# Patient Record
Sex: Male | Born: 1940
Health system: Southern US, Community
[De-identification: ages and names within clinical notes are randomized; demographics above are authoritative.]

## PROBLEM LIST (undated history)

## (undated) DIAGNOSIS — I10 Essential (primary) hypertension: Secondary | ICD-10-CM

## (undated) HISTORY — DX: Essential (primary) hypertension: I10

---

## 2015-11-28 DIAGNOSIS — R69 Illness, unspecified: Secondary | ICD-10-CM | POA: Diagnosis not present

## 2016-03-13 DIAGNOSIS — H2513 Age-related nuclear cataract, bilateral: Secondary | ICD-10-CM | POA: Diagnosis not present

## 2016-03-13 DIAGNOSIS — H524 Presbyopia: Secondary | ICD-10-CM | POA: Diagnosis not present

## 2016-03-13 DIAGNOSIS — Z0100 Encounter for examination of eyes and vision without abnormal findings: Secondary | ICD-10-CM | POA: Diagnosis not present

## 2016-04-17 ENCOUNTER — Ambulatory Visit (INDEPENDENT_AMBULATORY_CARE_PROVIDER_SITE_OTHER): Payer: Medicare HMO | Admitting: Physician Assistant

## 2016-04-17 VITALS — BP 168/90 | HR 84 | Temp 98.1°F | Resp 18 | Wt 127.4 lb

## 2016-04-17 DIAGNOSIS — Z711 Person with feared health complaint in whom no diagnosis is made: Secondary | ICD-10-CM | POA: Diagnosis not present

## 2016-04-17 DIAGNOSIS — R030 Elevated blood-pressure reading, without diagnosis of hypertension: Secondary | ICD-10-CM | POA: Diagnosis not present

## 2016-04-17 MED ORDER — LISINOPRIL 10 MG PO TABS: 10.0000 mg | ORAL_TABLET | Freq: Every day | ORAL | 3 refills | Status: DC

## 2016-04-17 NOTE — Patient Instructions (Addendum)
You have a high blood pressure reading. Please check your blood pressures at home and keep a log - Write down your blood pressure and bring that log to the office in one week.   Your blood pressure can be affected by your lifestyle. Please see below information for ways to help you bring your blood pressure down. Start exercising regularly - try to get in 20-30 minutes of vigorous exercise most days of the week.  If your blood pressure is still elevated in 1 week, we will start you on a medication. Please come back in one week for blood pressure recheck.   Thank you for coming in today. I hope you feel we met your needs.  Feel free to call [facility] if you have any questions or further requests.  Please consider signing up for MyChart if you do not already have it, as this is a great way to communicate with me.  Best,  Whitney McVey, PA-C  T?ng huy?t p Hypertension T?ng huy?t p, th??ng ???c g?i l huy?t p cao, l khi l?c b?m mu qua ??ng m?ch c?a qu v? qu m?nh. ??ng m?ch c?a qu v? l cc m?ch mu mang mu t? tim ?i kh?p c? th?. T?ng huy?t p khi?n tim lm vi?c v?t v? h?n ?? b?m mu v c th? khi?n cc ??ng m?ch tr? ln h?p ho?c c?ng. T?ng huy?t p khng ???c ?i?u tr? ho?c khng ki?m sot ???c c th? d?n t?i nh?i mu c? tim, ??t qu?, b?nh th?n v nh?ng v?n ?? khc. Ch? s? ?o huy?t p g?m m?t ch? s? cao trn m?t ch? s? th?p. Huy?t a?p ly? t???ng cu?a quy? vi? la? d??i 120/80. Ch? s? ??u tin ("??nh") ???c g?i l huy?t p tm thu. ?y l s? ?o p su?t trong ??ng m?ch khi tim qu v? ??p. Ch? s? th? hai ("?y") ???c g?i l huy?t p tm tr??ng. ?y l s? ?o p su?t trong ??ng m?ch khi tim qu v? ngh?Lourdes Sledge nhn g gy ra? Khng r nguyn nhn gy ra tnh tr?ng ny. ?i?u g lm t?ng nguy c?? M?t s? y?u t? nguy c? d?n ??n huy?t p cao c th? ki?m sot ???c. M?t s? y?u t? khc th khng. Nh?ng y?u t? qu v? c th? thay ??i   Ht thu?c.  B? b?nh ti?u ???ng tup 2, cholesterol cao, ho?c c?  hai.  Khng t?p th? d?c ho?c cc ho?t ??ng th? ch?t ??y ??Marland Kitchen  Th?a cn.  ?n qu nhi?u ch?t bo, ???ng, ca-lo, ho?c mu?i ([place]).  U?ng qu nhi?u r??u. Nh?ng y?u t? kh ho?c khng th? thay ??i   B?nh th?n m?n tnh.  C ti?n s? gia ?nh b? cao huy?t p.  ?? tu?i. Nguy c? t?ng ln theo ?? tu?i.  Ch?ng t?c. Qu v? c th? c nguy c? cao h?n n?u qu v? l ng??i M? g?c Phi.  Gi?i tnh. Nam gi?i c nguy c? cao h?n ph? n? tr??c tu?i 45. Sau tu?i 66, ph? n? c nguy c? cao h?n nam gi?i.  Ng?ng th? do t?c ngh?n khi ng?.  C?ng th?ng. Cc d?u hi?u ho?c tri?u ch?ng l g? Huy?t p qu cao (c?n t?ng huy?t p) c th? gy ra:  ?au ??u.  Lo u.  Kh th?.  Ch?y mu cam.  Bu?n nn v nn.  ?au ng?c n?ng.  C? ??ng gi?t gi?t qu v? khng th? ki?m sot ???c (co gi?t). Ch?n ?on tnh tr?ng ny nh? th? no? [place]  tr?ng ny ???c ch?n ?on b?ng cch ?o huy?t p c?a qu v? lc qu v? ng?i, ?? tay trn m?t m?t ph?ng. B?ng qu?n thi?t b? ?o huy?t p s? ???c qu?n tr?c ti?pvo vng da cnh tay pha trn c?a qu v? ngang v?i m?c tim. Huy?t p c?n ???c ?o t nh?t hai l?n trn cng m?t cnh tay. M?t s? tnh tr?ng nh?t ??nh c th? lm cho huy?t p khc nhau gi?a tay ph?i v tay tri c?a qu v?. M?t s? y?u t? nh?t ??nh c th? khi?n ch? s? ?o huy?t p th?p h?n ho?c cao h?n so v?i bnh th??ng (t?ng) trong th?i gian ng?n:  Khi huy?t p c?a qu v? ? phng khm c?a chuyn gia ch?m [place] s?c kh?e cao h?n so v?i lc qu v? ? nh, hi?n t??ng ny ???c g?i l t?ng huy?t p o chong tr?ng. H?u h?t nh?ng ng??i b? tnh tr?ng ny ??u khng c?n dng thu?c.  Khi huy?t p c?a qu v? lc ? nh cao h?n so v?i lc qu v? ? phng khm chuyn gia ch?m [place] s?c kh?e, hi?n t??ng ny ???c g?i l t?ng huy?t p m?t n?. H?u h?t nh?ng ng??i b? tnh tr?ng ny ??u c th? c?n dng thu?c ?? ki?m sot huy?t p. N?u qu v? c ch? s? huy?t p cao trong m?t l?n khm ho?c qu v? c huy?t p bnh th??ng c km cc y?u t? nguy c? khc:  Qu v? c  th? ???c yu c?u tr? l?i vo m?t ngy khc ?? ki?m tra l?i huy?t p.  Qu v? c th? ???c yu c?u theo di huy?t p t?i nh trong vng 1 tu?n ho?c lu h?n. N?u qu v? ???c ch?n ?on b? t?ng huy?t p, qu v? c th? c?n th?c hi?n cc xt nghi?m mu ho?c ki?m tra hnh ?nh khc ?? gip chuyn gia ch?m [place] s?c kh?e hi?u nguy c? t?ng th? m?c cc b?nh tr?ng khc. Tnh tr?ng ny ???c ?i?u tr? nh? th? no? Tnh tr?ng ny ???c ?i?u tr? b?ng cch thay ??i l?i s?ng lnh m?nh, ch?ng h?nh nh? ?n th?c ph?m c l?i cho s?c kh?e, t?p th? d?c nhi?u h?n v gi?m l??ng r??u u?ng vo. N?u thay ??i l?i s?ng khng ?? ?? ??a huy?t p v? m?c c th? ki?m sot ???c, chuyn gia ch?m Sisters s?c kh?e c th? k ??n thu?c, v n?u:  Huy?t p tm thu c?a qu v? trn 130.  Huy?t p tm tr??ng c?a qu v? trn 80. Huy?t p m?c tiu c nhn c?a qu v? c th? khc nhau ty thu?c v tnh tr?ng b?nh l, tu?i v cc nhn t? khc. Tun th? nh?ng h??ng d?n ny ? nh: ?n v u?ng   ?n ch? ?? giu ch?t x? v kali v t natri, ???ng ph? gia v ch?t bo. M?t k? ho?ch ?n m?u c tn ch? ?? ?n DASH (Cch ti?p c?n ?n u?ng ?? gi?m t?ng huy?t p). ?n theo cch ny:  ?n nhi?u tri cy v rau t??i. Vo m?i b?a ?n, c? g?ng dnh m?t n?a ??a cho tri cy v rau.  ?n ng? c?c nguyn h?t, ch?ng h?n nh? m ?ng lm t? b?t m nguyn cm, ho?c bnh m nguyn h?t. Cho ngu? c?c nguyn ca?m va?o m?t ph?n t? ??a c?a quy? vi?.  ?n ho?c hu?ng cc s?n ph?m t? s?a t bo, ch?ng h?n nh? s?a ? b? kem ho?c s?a chua t bo.  Trnh nh?ng mi?ng th?t nhi?u  m?, th?t ? qua ch? bi?n ho?c th?t ??p mu?i v th?t gia c?m c da. Dnh kho?ng m?t ph?n t? ??a c?a qu v? cho cc protein khng m?, ch?ng h?n nh? c, th?t g khng da, ??u, tr?ng, v ??u ph?.  Trnh nh?ng th?c ph?m ch? bi?n ho?c lm s?n. Nh?ng th?c ph?m ny th??ng c nhi?u natri, ???ng ph? gia v ch?t bo h?n.  Gi?m l??ng dng natri hng ngy c?a qu v?. H?u h?t nh?ng ng??i b? t?ng huy?t p ??u nn ?n d??i 1.500 mg natri  m?i ngy.  Gi?i h?n l??ng r??u qu v? u?ng khng qu 1 ly m?i ngy v?i ph? n? khng mang thai v 2 ly m?i ngy v?i nam gi?i. M?t ly t??ng ???ng v?i 12 ao-x? bia, 5 ao-x? r??u vang, ho?c 1 ao-x? r??u m?nh. L?i s?ng   H?p tc v?i chuyn gia ch?m [place] s?c kh?e c?a qu v? ?? duy tr tr?ng l??ng c? th? c l?i cho s?c kh?e ho?c gi?m cn. Hy h?i xem tr?ng l??ng no l l t??ng cho qu v?.  Dnh t nh?t 30 pht ?? t?p th? d?c m c th? khi?n tim qu v? ??p nhanh h?n (t?p th? d?c nh?p ?i?u) h?u h?t cc ngy trong tu?n. Cc ho?t ??ng c th? bao g?m ?i b?, b?i, ho?c ??p xe.  Bao g?m bi t?p t?ng c??ng c? (bi t?p khng l?c), ch?ng h?n nh? bi t?p [place] ho?c nng t?, nh? m?t ph?n c?a thi quen luy?n t?p hng tu?n c?a qu v?. C? g?ng t?p nh?ng lo?i bi t?p ny trong vng 30 pht t?i thi?u 3 ngy m?t tu?n.  Khng s? d?ng b?t k? s?n ph?m no ch?a nicotine ho?c thu?c l, ch?ng ha?n nh? thu?c l d?ng ht v thu?c l ?i?n t?. N?u qu v? c?n gip ?? ?? cai thu?c, hy h?i chuyn gia ch?m [place] s?c kh?e.  Theo di huy?t p c?a qu v? t?i nh theo h??ng d?n c?a chuyn gia ch?m [place] s?c kh?e.  Tun th? t?t c? cc cu?c h?n khm l?i theo ch? d?n c?a chuyn gia ch?m [place] s?c kh?e. ?i?u ny c vai tr quan tr?ng. Thu?c   Ch? s? d?ng thu?c khng k ??n v thu?c k ??n theo ch? d?n c?a chuyn gia ch?m [place] s?c kh?e. Lm theo ch? d?n m?t cch c?n th?n. Thu?c ?i?u tr? huy?t p ph?i ???c dng theo ??n ? k.  Khng b? li?u thu?c huy?t p. B? li?u khi?n qu v? c nguy c? g?p ph?i cc v?n ?? v c th? lm cho thu?c gi?m hi?u qu?Marland Kitchen  Hy h?i chuyn gia ch?m [place] s?c kh?e c?a qu v? v? nh?ng tc d?ng ph? ho?c ph?n ?ng v?i thu?c m qu v? ph?i theo di. Hy lin l?c v?i chuyn gia ch?m [place] s?c kh?e n?u:  Qu v? ngh? qu v? c ph?n ?ng v?i thu?c ?ang dng.  Qu v? b? ?au ??u ti?p t?c tr? l?i (ti pht).  Qu v? c?m th?y chng m?t.  Qu v? b? s?ng ph ? m?t c chn.  Qu v? c v?n ?? v? th? l?c. Yu c?u tr? gip ngay l?p t?c  n?u:  Qu v? b? ?au ??u n?ng ho?c l l?n.  Qu v? b? y?u b?t th??ng ho?c t b.  Quy? vi? ca?m th?y bi? ng?t.  Qu v? b? ?au r?t nhi?u ? ng?c ho?c b?ng.  Qu v? nn nhi?u l?n.  Qu v? b? kh th?. Tm t?t  T?ng huy?t p l khi  l?c b?m mu qua ??ng m?ch c?a qu v? qu m?nh. N?u tnh tr?ng ny khng ???c ki?m sot, n c th? khi?n qu v? g?p ph?i nguy c? bi?n ch?ng nghim tr?ng.  Huy?t p m?c tiu c nhn c?a qu v? c th? khc nhau ty thu?c v tnh tr?ng b?nh l, tu?i v cc nhn t? khc. ??i v?i h?u h?t m?i ng??i, huy?t p bnh th??ng l d??i 120/80.  ?i?u tr? t?ng huy?t p b?ng cch thay ??i l?i s?ng, dng thu?c, ho?c k?t h?p c? hai. Thay ??i l?i s?ng bao g?m gi?m cn, ?n ch? ?? ?n c l?i cho s?c kh?e, t mu?i, t?p th? d?c nhi?u h?n v h?n ch? u?ng r??u. Thng tin ny khng nh?m m?c ?ch thay th? cho l?i khuyn m chuyn gia ch?m [place] s?c kh?e ni v?i qu v?. Hy b?o ??m qu v? ph?i th?o lu?n b?t k? v?n ?? g m qu v? c v?i chuyn gia ch?m [place] s?c kh?e c?a qu v?. Document Released: 01/30/2005 Document Revised: 01/12/2016 Document Reviewed: 01/12/2016 Elsevier Interactive Patient Education  2017 [employer].

## 2016-04-17 NOTE — Progress Notes (Signed)
   Kenneth Moore  MRN: [id number] DOB: 11-25-1940  PCP: No primary care provider on file.  Subjective:  Pt is a 76 year old male who presents to clinic for blood pressure check. He speaks [place]. He is here today with his daughter who is interpreting for him.   His family member is being treated for high blood pressure and he is concerned his may be high as well. Home blood pressures: 137/?, 130's/?. Blood pressure in office is 154/80, repeat is 168/90.  He eats mostly "asian food" rice, meat, vegetables.  Does not exercise.  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  He is feeling well today, no complaints. Denies headache, chest pain, chest tightness, vision changes, syncope, SOB.  Fhx - No heart problems.   Review of Systems  Constitutional: Negative for chills, diaphoresis and fever.  Respiratory: Negative for cough, chest tightness, shortness of breath and wheezing.   Cardiovascular: Negative for chest pain, palpitations and leg swelling.  Gastrointestinal: Negative for diarrhea, nausea and vomiting.  Musculoskeletal: Negative for neck pain.  Neurological: Negative for dizziness, syncope, light-headedness and headaches.  Psychiatric/Behavioral: Negative for sleep disturbance. The patient is not nervous/anxious.     There are no active problems to display for this patient.   No current outpatient prescriptions on file prior to visit.   No current facility-administered medications on file prior to visit.     No Known Allergies   Objective:  BP (!) 154/80 (BP Location: Right Arm, Patient Position: Sitting, Cuff Size: Small)   Pulse (!) 102   Temp 98.1 F (36.7 C) (Oral)   Resp 18   Wt 127 lb 6.4 oz (57.8 kg)   SpO2 98%   Physical Exam  Constitutional: He is oriented to person, place, and time and well-developed, well-nourished, and in no distress. No distress.  Cardiovascular: Normal rate, regular rhythm and normal heart sounds.   Pulmonary/Chest:  Effort normal. No respiratory distress.  Neurological: He is alert and oriented to person, place, and time. GCS score is 15.  Skin: Skin is warm and dry.  Psychiatric: Mood, memory, affect and judgment normal.  Vitals reviewed.   Assessment and Plan :  1. Elevated blood pressure reading 2. Physically well but worried - Recheck vitals - Pt is a pleasant 76 year old male presenting for concerns about his blood pressure. His wife is being treated for hypertension and he would like to know if he needs medications. Blood pressure today is 154/80. He reports his home blood pressures are 130's/?. He appears to be a new pt to the [facility] system - no previous blood pressure readings are on file.     Possible white coat syndrome. Denies cardiac symptoms. Asked pt to keep a log of home blood pressures and bring it with him at his f/u appt in 1 week. DASH diet and exercise encouraged. Consider medication at next OV if blood pressures are high.    Marco CollieWhitney Pascale Maves, PA-C  Primary Care at [facility] 04/17/2016 6:24 PM

## 2016-04-24 ENCOUNTER — Ambulatory Visit: Payer: Medicare HMO

## 2016-05-31 DIAGNOSIS — L409 Psoriasis, unspecified: Secondary | ICD-10-CM | POA: Diagnosis not present

## 2016-11-07 ENCOUNTER — Ambulatory Visit (INDEPENDENT_AMBULATORY_CARE_PROVIDER_SITE_OTHER): Payer: Medicare HMO

## 2016-11-07 ENCOUNTER — Encounter: Payer: Self-pay | Admitting: Emergency Medicine

## 2016-11-07 ENCOUNTER — Ambulatory Visit (INDEPENDENT_AMBULATORY_CARE_PROVIDER_SITE_OTHER): Payer: Medicare HMO | Admitting: Emergency Medicine

## 2016-11-07 ENCOUNTER — Ambulatory Visit: Payer: Medicare HMO

## 2016-11-07 VITALS — BP 128/66 | HR 87 | Temp 98.7°F | Resp 16 | Ht 62.0 in | Wt 125.4 lb

## 2016-11-07 DIAGNOSIS — M25512 Pain in left shoulder: Secondary | ICD-10-CM

## 2016-11-07 DIAGNOSIS — M19011 Primary osteoarthritis, right shoulder: Secondary | ICD-10-CM | POA: Diagnosis not present

## 2016-11-07 DIAGNOSIS — M25511 Pain in right shoulder: Secondary | ICD-10-CM | POA: Insufficient documentation

## 2016-11-07 DIAGNOSIS — Z23 Encounter for immunization: Secondary | ICD-10-CM

## 2016-11-07 DIAGNOSIS — M19019 Primary osteoarthritis, unspecified shoulder: Secondary | ICD-10-CM | POA: Insufficient documentation

## 2016-11-07 DIAGNOSIS — M19012 Primary osteoarthritis, left shoulder: Secondary | ICD-10-CM | POA: Diagnosis not present

## 2016-11-07 MED ORDER — DICLOFENAC SODIUM 50 MG PO TBEC: 50.0000 mg | DELAYED_RELEASE_TABLET | Freq: Two times a day (BID) | ORAL | 1 refills | Status: AC

## 2016-11-07 NOTE — Progress Notes (Signed)
Kenneth Moore 76 y.o.   Chief Complaint  Patient presents with  . Shoulder Pain    BOTH X 2 MONTHS    HISTORY OF PRESENT ILLNESS: This is a 76 y.o. male complaining of bilateral shoulder pain x several months; denies injury. Shoulder Pain   The pain is present in the right shoulder and left shoulder. This is a new problem. The current episode started more than 1 month ago. There has been no history of extremity trauma. The problem occurs constantly. The problem has been waxing and waning. The quality of the pain is described as aching. The pain is at a severity of 5/10. The pain is moderate. Associated symptoms include a limited range of motion. Pertinent negatives include no fever, itching, joint locking, joint swelling, numbness or tingling. The symptoms are aggravated by activity. He has tried nothing for the symptoms. Family history does not include gout or rheumatoid arthritis. There is no history of diabetes or osteoarthritis.     Prior to Admission medications   Medication Sig Start Date End Date Taking? Authorizing Provider  lisinopril (PRINIVIL,ZESTRIL) 10 MG tablet Take 10 mg by mouth daily.   Yes [provider]    No Known Allergies  There are no active problems to display for this patient.   No past medical history on file.  No past surgical history on file.  Social History   Social History  . Marital status: Married    Spouse name: N/A  . Number of children: N/A  . Years of education: N/A   Occupational History  . Not on file.   Social History Main Topics  . Smoking status: Never Smoker  . Smokeless tobacco: Never Used  . Alcohol use No  . Drug use: Unknown  . Sexual activity: Not on file   Other Topics Concern  . Not on file   Social History Narrative  . No narrative on file    No family history on file.   Review of Systems  Constitutional: Negative for chills and fever.  HENT: Negative.   Eyes: Negative.   Respiratory: Negative.   Negative for cough and shortness of breath.   Cardiovascular: Negative.  Negative for chest pain and palpitations.  Gastrointestinal: Negative for abdominal pain, nausea and vomiting.  Genitourinary: Negative for dysuria and hematuria.  Musculoskeletal: Positive for joint pain.  Skin: Negative for itching.  Neurological: Negative for tingling, sensory change, focal weakness and numbness.  Endo/Heme/Allergies: Negative.   All other systems reviewed and are negative.  Vitals:   11/07/16 1614  BP: 128/66  Pulse: 87  Resp: 16  Temp: 98.7 F (37.1 C)  SpO2: 98%     Physical Exam  Constitutional: He is oriented to person, place, and time. He appears well-developed and well-nourished.  HENT:  Head: Normocephalic and atraumatic.  Eyes: Pupils are equal, round, and reactive to light. Conjunctivae and EOM are normal.  Neck: Normal range of motion. Neck supple.  Cardiovascular: Normal rate, regular rhythm and normal heart sounds.   Pulmonary/Chest: Effort normal and breath sounds normal.  Abdominal: Soft. There is no tenderness.  Musculoskeletal:  Shoulders: no deformities; LROM due to pain.  Neurological: He is alert and oriented to person, place, and time. No sensory deficit. He exhibits normal muscle tone.  Skin: Skin is warm and dry. Capillary refill takes less than 2 seconds. No rash noted.  Psychiatric: He has a normal mood and affect. His behavior is normal.  Vitals reviewed.   Dg Shoulder Right  Result Date: 11/07/2016 CLINICAL DATA:  Bilateral shoulder pain.  No reported acute injury. EXAM: RIGHT SHOULDER - 2+ VIEW COMPARISON:  None. FINDINGS: The mineralization and alignment are normal. There is no evidence of acute fracture or dislocation. There are mild acromioclavicular and glenohumeral degenerative changes. The subacromial space is adequately preserved, although there is mild subacromial spurring. Degenerative changes are noted within the cervical spine. IMPRESSION: No acute  osseous findings.  Mild degenerative changes. Electronically Signed   By: Carey Bullocks M.D.   On: 11/07/2016 17:12   Dg Shoulder Left  Result Date: 11/07/2016 CLINICAL DATA:  Bilateral shoulder pain.  No reported acute injury. EXAM: LEFT SHOULDER - 2+ VIEW COMPARISON:  None. FINDINGS: The mineralization and alignment are normal. There is no evidence of acute fracture or dislocation. Mild acromioclavicular and glenohumeral degenerative changes. The subacromial space is adequately preserved, although there is mild subacromial spurring. IMPRESSION: No acute osseous findings.  Mild degenerative changes. Electronically Signed   By: Carey Bullocks M.D.   On: 11/07/2016 17:11     ASSESSMENT & PLAN: Manus was seen today for shoulder pain.  Diagnoses and all orders for this visit:  Pain of both shoulder joints -     DG Shoulder Left; Future -     DG Shoulder Right; Future -     diclofenac (VOLTAREN) 50 MG EC tablet; Take 1 tablet (50 mg total) by mouth 2 (two) times daily.  Arthritis, shoulder region  Other orders -     Pneumococcal conjugate vaccine 13-valent IM    Patient Instructions       IF you received an x-ray today, you will receive an invoice from [facility]. Please contact [facility] at [phone] with questions or concerns regarding your invoice.   IF you received labwork today, you will receive an invoice from [place]. Please contact [facility] at [phone] with questions or concerns regarding your invoice.   Our billing staff will not be able to assist you with questions regarding bills from these companies.  You will be contacted with the lab results as soon as they are available. The fastest way to get your results is to activate your My Chart account. Instructions are located on the last page of this paperwork. If you have not heard from [place] regarding the results in 2 weeks, please contact this office.      Place shoulder pain patient  instructions here.  Shoulder Pain Many things can cause shoulder pain, including:  An injury.  Moving the arm in the same way again and again (overuse).  Joint pain (arthritis).  Follow these instructions at home: Take these actions to help with your pain:  Squeeze a soft ball or a foam pad as much as you can. This helps to prevent swelling. It also makes the arm stronger.  Take over-the-counter and prescription medicines only as told by your doctor.  If told, put ice on the area: ? Put ice in a plastic bag. ? Place a towel between your skin and the bag. ? Leave the ice on for 20 minutes, 2-3 times per day. Stop putting on ice if it does not help with the pain.  If you were given a shoulder sling or immobilizer: ? Wear it as told. ? Remove it to shower or bathe. ? Move your arm as little as possible. ? Keep your hand moving. This helps prevent swelling.  Contact a doctor if:  Your pain gets worse.  Medicine does not help your pain.  You have new pain in your arm, hand, or fingers. Get help right away if:  Your arm, hand, or fingers: ? Tingle. ? Are numb. ? Are swollen. ? Are painful. ? Turn white or blue. This information is not intended to replace advice given to you by your health care provider. Make sure you discuss any questions you have with your health care provider. Document Released: 07/19/2007 Document Revised: 09/26/2015 Document Reviewed: 05/25/2014 Elsevier Interactive Patient Education  2018 [employer].      Edwina Barth, MD [facility]

## 2016-11-07 NOTE — Patient Instructions (Addendum)
     IF you received an x-ray today, you will receive an invoice from [facility]. Please contact [facility] at [phone] with questions or concerns regarding your invoice.   IF you received labwork today, you will receive an invoice from [place]. Please contact [facility] at [phone] with questions or concerns regarding your invoice.   Our billing staff will not be able to assist you with questions regarding bills from these companies.  You will be contacted with the lab results as soon as they are available. The fastest way to get your results is to activate your My Chart account. Instructions are located on the last page of this paperwork. If you have not heard from [place] regarding the results in 2 weeks, please contact this office.      Place shoulder pain patient instructions here.  Shoulder Pain Many things can cause shoulder pain, including:  An injury.  Moving the arm in the same way again and again (overuse).  Joint pain (arthritis).  Follow these instructions at home: Take these actions to help with your pain:  Squeeze a soft ball or a foam pad as much as you can. This helps to prevent swelling. It also makes the arm stronger.  Take over-the-counter and prescription medicines only as told by your doctor.  If told, put ice on the area: ? Put ice in a plastic bag. ? Place a towel between your skin and the bag. ? Leave the ice on for 20 minutes, 2-3 times per day. Stop putting on ice if it does not help with the pain.  If you were given a shoulder sling or immobilizer: ? Wear it as told. ? Remove it to shower or bathe. ? Move your arm as little as possible. ? Keep your hand moving. This helps prevent swelling.  Contact a doctor if:  Your pain gets worse.  Medicine does not help your pain.  You have new pain in your arm, hand, or fingers. Get help right away if:  Your arm, hand, or fingers: ? Tingle. ? Are numb. ? Are swollen. ? Are  painful. ? Turn white or blue. This information is not intended to replace advice given to you by your health care provider. Make sure you discuss any questions you have with your health care provider. Document Released: 07/19/2007 Document Revised: 09/26/2015 Document Reviewed: 05/25/2014 Elsevier Interactive Patient Education  [employer].

## 2016-11-28 DIAGNOSIS — R69 Illness, unspecified: Secondary | ICD-10-CM | POA: Diagnosis not present

## 2016-12-13 ENCOUNTER — Encounter: Payer: Self-pay | Admitting: *Deleted

## 2017-04-17 ENCOUNTER — Ambulatory Visit (INDEPENDENT_AMBULATORY_CARE_PROVIDER_SITE_OTHER): Payer: Medicare HMO

## 2017-04-17 ENCOUNTER — Other Ambulatory Visit: Payer: Self-pay

## 2017-04-17 ENCOUNTER — Ambulatory Visit (INDEPENDENT_AMBULATORY_CARE_PROVIDER_SITE_OTHER): Payer: Medicare HMO | Admitting: Emergency Medicine

## 2017-04-17 ENCOUNTER — Ambulatory Visit: Payer: Medicare HMO | Admitting: Emergency Medicine

## 2017-04-17 ENCOUNTER — Encounter: Payer: Self-pay | Admitting: Emergency Medicine

## 2017-04-17 VITALS — BP 150/73 | HR 105 | Temp 98.1°F | Resp 16 | Ht 62.0 in | Wt 127.2 lb

## 2017-04-17 DIAGNOSIS — J22 Unspecified acute lower respiratory infection: Secondary | ICD-10-CM | POA: Insufficient documentation

## 2017-04-17 DIAGNOSIS — R059 Cough, unspecified: Secondary | ICD-10-CM

## 2017-04-17 DIAGNOSIS — R05 Cough: Secondary | ICD-10-CM | POA: Diagnosis not present

## 2017-04-17 MED ORDER — DOXYCYCLINE HYCLATE 100 MG PO TABS: 100.0000 mg | ORAL_TABLET | Freq: Two times a day (BID) | ORAL | 0 refills | Status: DC

## 2017-04-17 MED ORDER — BENZONATATE 200 MG PO CAPS
200.0000 mg | ORAL_CAPSULE | Freq: Two times a day (BID) | ORAL | 0 refills | Status: DC | PRN
Start: 2017-04-17 — End: 2019-01-02

## 2017-04-17 NOTE — Patient Instructions (Addendum)
     IF you received an x-ray today, you will receive an invoice from [facility]. Please contact [facility] at [phone] with questions or concerns regarding your invoice.   IF you received labwork today, you will receive an invoice from [facility]. Please contact [facility] at [phone] with questions or concerns regarding your invoice.   Our billing staff will not be able to assist you with questions regarding bills from these companies.  You will be contacted with the lab results as soon as they are available. The fastest way to get your results is to activate your My Chart account. Instructions are located on the last page of this paperwork. If you have not heard from us regarding the results in 2 weeks, please contact this office.     Cough, Adult A cough helps to clear your throat and lungs. A cough may last only 2-3 weeks (acute), or it may last longer than 8 weeks (chronic). Many different things can cause a cough. A cough may be a sign of an illness or another medical condition. Follow these instructions at home:  Pay attention to any changes in your cough.  Take medicines only as told by your doctor. ? If you were prescribed an antibiotic medicine, take it as told by your doctor. Do not stop taking it even if you start to feel better. ? Talk with your doctor before you try using a cough medicine.  Drink enough fluid to keep your pee (urine) clear or pale yellow.  If the air is dry, use a cold steam vaporizer or humidifier in your home.  Stay away from things that make you cough at work or at home.  If your cough is worse at night, try using extra pillows to raise your head up higher while you sleep.  Do not smoke, and try not to be around smoke. If you need help quitting, ask your doctor.  Do not have caffeine.  Do not drink alcohol.  Rest as needed. Contact a doctor if:  You have new problems (symptoms).  You cough up yellow fluid  (pus).  Your cough does not get better after 2-3 weeks, or your cough gets worse.  Medicine does not help your cough and you are not sleeping well.  You have pain that gets worse or pain that is not helped with medicine.  You have a fever.  You are losing weight and you do not know why.  You have night sweats. Get help right away if:  You cough up blood.  You have trouble breathing.  Your heartbeat is very fast. This information is not intended to replace advice given to you by your health care provider. Make sure you discuss any questions you have with your health care provider. Document Released: 10/13/2010 Document Revised: 07/08/2015 Document Reviewed: 04/08/2014 Elsevier Interactive Patient Education  2018 [employer]  

## 2017-04-17 NOTE — Progress Notes (Signed)
Kenneth Moore 77 y.o.   Chief Complaint  Patient presents with  . Cough    per patient for over a half month    HISTORY OF PRESENT ILLNESS: This is a 77 y.o. male complaining of cough productive of clear phlegm for the past 2-3 weeks.  Denies fever or chills.  Denies hemoptysis.  Denies difficulty breathing or chest pain.  Non-smoker.  No history of COPD.  Denies any other significant symptoms.   Cough  This is a new problem. The current episode started 1 to 4 weeks ago. The problem has been gradually worsening. The problem occurs constantly. The cough is productive of sputum (Clear sputum). Pertinent negatives include no chest pain, chills, ear congestion, ear pain, fever, headaches, heartburn, hemoptysis, myalgias, nasal congestion, postnasal drip, rash, rhinorrhea, sore throat, shortness of breath, sweats, weight loss or wheezing. Nothing aggravates the symptoms. He has tried nothing for the symptoms. There is no history of asthma, bronchitis, COPD, emphysema or pneumonia.     Prior to Admission medications   Medication Sig Start Date End Date Taking? Authorizing Provider  lisinopril (PRINIVIL,ZESTRIL) 10 MG tablet Take 10 mg by mouth daily.   Yes [provider]  Pseudoephedrine-Guaifenesin (MUCINEX D PO) Take by mouth as needed.   Yes [provider]    No Known Allergies  Patient Active Problem List   Diagnosis Date Noted  . Pain of both shoulder joints 11/07/2016  . Arthritis, shoulder region 11/07/2016    No past medical history on file.    Social History   Socioeconomic History  . Marital status: Married    Spouse name: Not on file  . Number of children: Not on file  . Years of education: Not on file  . Highest education level: Not on file  Social Needs  . Financial resource strain: Not on file  . Food insecurity - worry: Not on file  . Food insecurity - inability: Not on file  . Transportation needs - medical: Not on file  . Transportation  needs - non-medical: Not on file  Occupational History  . Not on file  Tobacco Use  . Smoking status: Never Smoker  . Smokeless tobacco: Never Used  Substance and Sexual Activity  . Alcohol use: No  . Drug use: Not on file  . Sexual activity: Not on file  Other Topics Concern  . Not on file  Social History Narrative  . Not on file    No family history on file.   Review of Systems  Constitutional: Negative for chills, fever, malaise/fatigue and weight loss.  HENT: Negative.  Negative for ear pain, postnasal drip, rhinorrhea and sore throat.   Eyes: Negative.  Negative for blurred vision and double vision.  Respiratory: Positive for cough. Negative for hemoptysis, shortness of breath and wheezing.   Cardiovascular: Negative for chest pain and palpitations.  Gastrointestinal: Negative.  Negative for abdominal pain, diarrhea, heartburn, nausea and vomiting.  Genitourinary: Negative.  Negative for dysuria and hematuria.  Musculoskeletal: Negative.  Negative for back pain, myalgias and neck pain.  Skin: Negative.  Negative for rash.  Neurological: Negative.  Negative for dizziness and headaches.  Endo/Heme/Allergies: Negative.   All other systems reviewed and are negative.   Vitals:   04/17/17 1113  BP: (!) 150/73  Pulse: (!) 105  Resp: 16  Temp: 98.1 F (36.7 C)  SpO2: 95%    Physical Exam  Constitutional: He is oriented to person, place, and time. He appears well-developed and well-nourished.  HENT:  Head: Normocephalic and atraumatic.  Nose: Nose normal.  Mouth/Throat: No oropharyngeal exudate.  Poor dentition  Eyes: Conjunctivae and EOM are normal. Pupils are equal, round, and reactive to light.  Neck: Normal range of motion. Neck supple. No JVD present. No thyromegaly present.  Cardiovascular: Normal rate, regular rhythm and normal heart sounds.  Pulmonary/Chest: Effort normal and breath sounds normal.  Abdominal: Soft. Bowel sounds are normal. He exhibits no  distension. There is no tenderness.  Musculoskeletal: Normal range of motion. He exhibits no edema or tenderness.  Lymphadenopathy:    He has no cervical adenopathy.  Neurological: He is alert and oriented to person, place, and time. No sensory deficit. He exhibits normal muscle tone.  Skin: Skin is warm and dry. Capillary refill takes less than 2 seconds. No rash noted.  Psychiatric: He has a normal mood and affect. His behavior is normal.  Vitals reviewed.   Chest x-ray reviewed by me.  NAD.  Radiologist report reviewed.  Chest x-ray also reviewed with patient and daughter in the room.  Dg Chest 2 View  Result Date: 04/17/2017 CLINICAL DATA:  Cough.  Nonsmoker. EXAM: CHEST  2 VIEW COMPARISON:  None in PACs FINDINGS: The lungs are adequately inflated. The interstitial markings are coarse. There is no alveolar infiltrate or pleural effusion. The heart and pulmonary vascularity are normal. There is gentle dextrocurvature centered in the midthoracic spine. IMPRESSION: Mild interstitial prominence bilaterally likely reflects chronic bronchitic changes. No alveolar pneumonia nor CHF. Electronically Signed   By: David  [place] M.D.   On: 04/17/2017 11:37    ASSESSMENT & PLAN: Grady was seen today for cough.  Diagnoses and all orders for this visit:  Cough -     CBC with Differential/Platelet -     Comprehensive metabolic panel -     DG Chest 2 View; Future -     benzonatate (TESSALON) 200 MG capsule; Take 1 capsule (200 mg total) by mouth 2 (two) times daily as needed for cough.  Lower respiratory infection -     doxycycline (VIBRA-TABS) 100 MG tablet; Take 1 tablet (100 mg total) by mouth 2 (two) times daily.    Patient Instructions       IF you received an x-ray today, you will receive an invoice from [facility]. Please contact [facility] at [phone] with questions or concerns regarding your invoice.   IF you received labwork today, you will receive an  invoice from [place]. Please contact [facility] at [phone] with questions or concerns regarding your invoice.   Our billing staff will not be able to assist you with questions regarding bills from these companies.  You will be contacted with the lab results as soon as they are available. The fastest way to get your results is to activate your My Chart account. Instructions are located on the last page of this paperwork. If you have not heard from [place] regarding the results in 2 weeks, please contact this office.     Cough, Adult A cough helps to clear your throat and lungs. A cough may last only 2-3 weeks (acute), or it may last longer than 8 weeks (chronic). Many different things can cause a cough. A cough may be a sign of an illness or another medical condition. Follow these instructions at home:  Pay attention to any changes in your cough.  Take medicines only as told by your doctor. ? If you were prescribed an antibiotic medicine, take it as told by your doctor.  Do not stop taking it even if you start to feel better. ? Talk with your doctor before you try using a cough medicine.  Drink enough fluid to keep your pee (urine) clear or pale yellow.  If the air is dry, use a cold steam vaporizer or humidifier in your home.  Stay away from things that make you cough at work or at home.  If your cough is worse at night, try using extra pillows to raise your head up higher while you sleep.  Do not smoke, and try not to be around smoke. If you need help quitting, ask your doctor.  Do not have caffeine.  Do not drink alcohol.  Rest as needed. Contact a doctor if:  You have new problems (symptoms).  You cough up yellow fluid (pus).  Your cough does not get better after 2-3 weeks, or your cough gets worse.  Medicine does not help your cough and you are not sleeping well.  You have pain that gets worse or pain that is not helped with medicine.  You have a fever.  You are  losing weight and you do not know why.  You have night sweats. Get help right away if:  You cough up blood.  You have trouble breathing.  Your heartbeat is very fast. This information is not intended to replace advice given to you by your health care provider. Make sure you discuss any questions you have with your health care provider. Document Released: 10/13/2010 Document Revised: 07/08/2015 Document Reviewed: 04/08/2014 Elsevier Interactive Patient Education  2018 Elsevier Inc.      Edwina BarthMiguel Marlene Beidler, MD [facility]

## 2017-04-18 ENCOUNTER — Encounter: Payer: Self-pay | Admitting: Radiology

## 2017-04-18 LAB — CBC WITH DIFFERENTIAL/PLATELET
Basophils Absolute: 0 10*3/uL (ref 0.0–0.2)
Basos: 0 %
EOS (ABSOLUTE): 0.1 10*3/uL (ref 0.0–0.4)
EOS: 1 %
HEMOGLOBIN: 15 g/dL (ref 13.0–17.7)
Hematocrit: 44.6 % (ref 37.5–51.0)
IMMATURE GRANS (ABS): 0 10*3/uL (ref 0.0–0.1)
Immature Granulocytes: 1 %
LYMPHS: 23 %
Lymphocytes Absolute: 1.8 10*3/uL (ref 0.7–3.1)
MCH: 32.5 pg (ref 26.6–33.0)
MCHC: 33.6 g/dL (ref 31.5–35.7)
MCV: 97 fL (ref 79–97)
MONOCYTES: 6 %
Monocytes Absolute: 0.5 10*3/uL (ref 0.1–0.9)
Neutrophils Absolute: 5.6 10*3/uL (ref 1.4–7.0)
Neutrophils: 69 %
Platelets: 203 10*3/uL (ref 150–379)
RBC: 4.61 x10E6/uL (ref 4.14–5.80)
RDW: 13.4 % (ref 12.3–15.4)
WBC: 8.1 10*3/uL (ref 3.4–10.8)

## 2017-04-18 LAB — COMPREHENSIVE METABOLIC PANEL
ALBUMIN: 4 g/dL (ref 3.5–4.8)
ALT: 25 IU/L (ref 0–44)
AST: 25 IU/L (ref 0–40)
Albumin/Globulin Ratio: 1.4 (ref 1.2–2.2)
Alkaline Phosphatase: 87 IU/L (ref 39–117)
BUN / CREAT RATIO: 14 (ref 10–24)
BUN: 15 mg/dL (ref 8–27)
Bilirubin Total: <0.2 mg/dL (ref 0.0–1.2)
CALCIUM: 9.5 mg/dL (ref 8.6–10.2)
CO2: 20 mmol/L (ref 20–29)
CREATININE: 1.1 mg/dL (ref 0.76–1.27)
Chloride: 103 mmol/L (ref 96–106)
GFR, EST AFRICAN AMERICAN: 76 mL/min/{1.73_m2} (ref >59)
GFR, EST NON AFRICAN AMERICAN: 65 mL/min/{1.73_m2} (ref >59)
GLUCOSE: 162 mg/dL — AB (ref 65–99)
Globulin, Total: 2.9 g/dL (ref 1.5–4.5)
Potassium: 4 mmol/L (ref 3.5–5.2)
Sodium: 140 mmol/L (ref 134–144)
TOTAL PROTEIN: 6.9 g/dL (ref 6.0–8.5)

## 2017-04-29 ENCOUNTER — Other Ambulatory Visit: Payer: Self-pay | Admitting: Physician Assistant

## 2017-04-29 DIAGNOSIS — Z711 Person with feared health complaint in whom no diagnosis is made: Secondary | ICD-10-CM

## 2017-05-06 ENCOUNTER — Other Ambulatory Visit: Payer: Self-pay | Admitting: Physician Assistant

## 2017-05-06 DIAGNOSIS — Z711 Person with feared health complaint in whom no diagnosis is made: Secondary | ICD-10-CM

## 2017-07-24 ENCOUNTER — Ambulatory Visit: Payer: Medicare HMO | Admitting: Emergency Medicine

## 2017-11-19 DIAGNOSIS — R69 Illness, unspecified: Secondary | ICD-10-CM | POA: Diagnosis not present

## 2017-12-24 DIAGNOSIS — L409 Psoriasis, unspecified: Secondary | ICD-10-CM | POA: Diagnosis not present

## 2018-03-05 ENCOUNTER — Telehealth: Payer: Self-pay | Admitting: *Deleted

## 2018-03-05 NOTE — Telephone Encounter (Signed)
Lm to schedule AWV for medicare

## 2018-05-09 ENCOUNTER — Telehealth: Payer: Self-pay | Admitting: Emergency Medicine

## 2018-05-09 NOTE — Telephone Encounter (Unsigned)
Copied from CRM [id number]. Topic: Quick Communication - Rx Refill/Question >> May 09, 2018  9:57 AM Marylen Ponto wrote: Medication: lisinopril (PRINIVIL,ZESTRIL) 10 MG tablet  Has the patient contacted their pharmacy? yes   Preferred Pharmacy (with phone number or street name): [facility] [phone] [place], [place] - [address] [phone] (Phone)  [phone] (Fax)  Agent: Please be advised that RX refills may take up to 3 business days. We ask that you follow-up with your pharmacy.

## 2018-05-11 ENCOUNTER — Other Ambulatory Visit: Payer: Self-pay

## 2018-05-11 MED ORDER — LISINOPRIL 10 MG PO TABS: 10.0000 mg | ORAL_TABLET | Freq: Every day | ORAL | 0 refills | Status: DC

## 2018-05-11 NOTE — Telephone Encounter (Signed)
Called pt and Informed she needs an OV for refills.

## 2018-05-27 ENCOUNTER — Ambulatory Visit: Payer: Medicare HMO | Admitting: Emergency Medicine

## 2018-06-03 ENCOUNTER — Other Ambulatory Visit: Payer: Self-pay | Admitting: Emergency Medicine

## 2018-06-19 ENCOUNTER — Other Ambulatory Visit: Payer: Self-pay | Admitting: *Deleted

## 2018-06-24 ENCOUNTER — Other Ambulatory Visit: Payer: Self-pay

## 2018-06-24 ENCOUNTER — Encounter: Payer: Self-pay | Admitting: Emergency Medicine

## 2018-06-24 ENCOUNTER — Ambulatory Visit (INDEPENDENT_AMBULATORY_CARE_PROVIDER_SITE_OTHER): Payer: Medicare HMO | Admitting: Emergency Medicine

## 2018-06-24 VITALS — BP 116/75 | HR 110 | Temp 98.3°F | Resp 20 | Ht 62.6 in | Wt 127.8 lb

## 2018-06-24 DIAGNOSIS — Z23 Encounter for immunization: Secondary | ICD-10-CM | POA: Diagnosis not present

## 2018-06-24 DIAGNOSIS — I10 Essential (primary) hypertension: Secondary | ICD-10-CM | POA: Insufficient documentation

## 2018-06-24 MED ORDER — LISINOPRIL 10 MG PO TABS
10.0000 mg | ORAL_TABLET | Freq: Every day | ORAL | 3 refills | Status: DC
Start: 2018-06-24 — End: 2019-01-02

## 2018-06-24 NOTE — Patient Instructions (Addendum)
   If you have lab work done today you will be contacted with your lab results within the next 2 weeks.  If you have not heard from us then please contact us. The fastest way to get your results is to register for My Chart.   IF you received an x-ray today, you will receive an invoice from [facility]. Please contact [facility] at [phone] with questions or concerns regarding your invoice.   IF you received labwork today, you will receive an invoice from [facility]. Please contact [facility] at [phone] with questions or concerns regarding your invoice.   Our billing staff will not be able to assist you with questions regarding bills from these companies.  You will be contacted with the lab results as soon as they are available. The fastest way to get your results is to activate your My Chart account. Instructions are located on the last page of this paperwork. If you have not heard from us regarding the results in 2 weeks, please contact this office.       Hypertension Hypertension, commonly called high blood pressure, is when the force of blood pumping through the arteries is too strong. The arteries are the blood vessels that carry blood from the heart throughout the body. Hypertension forces the heart to work harder to pump blood and may cause arteries to become narrow or stiff. Having untreated or uncontrolled hypertension can cause heart attacks, strokes, kidney disease, and other problems. A blood pressure reading consists of a higher number over a lower number. Ideally, your blood pressure should be below 120/80. The first ("top") number is called the systolic pressure. It is a measure of the pressure in your arteries as your heart beats. The second ("bottom") number is called the diastolic pressure. It is a measure of the pressure in your arteries as the heart relaxes. What are the causes? The cause of this condition is not known. What increases the  risk? Some risk factors for high blood pressure are under your control. Others are not. Factors you can change  Smoking.  Having type 2 diabetes mellitus, high cholesterol, or both.  Not getting enough exercise or physical activity.  Being overweight.  Having too much fat, sugar, calories, or salt (sodium) in your diet.  Drinking too much alcohol. Factors that are difficult or impossible to change  Having chronic kidney disease.  Having a family history of high blood pressure.  Age. Risk increases with age.  Race. You may be at higher risk if you are African-American.  Gender. Men are at higher risk than women before age 45. After age 65, women are at higher risk than men.  Having obstructive sleep apnea.  Stress. What are the signs or symptoms? Extremely high blood pressure (hypertensive crisis) may cause:  Headache.  Anxiety.  Shortness of breath.  Nosebleed.  Nausea and vomiting.  Severe chest pain.  Jerky movements you cannot control (seizures). How is this diagnosed? This condition is diagnosed by measuring your blood pressure while you are seated, with your arm resting on a surface. The cuff of the blood pressure monitor will be placed directly against the skin of your upper arm at the level of your heart. It should be measured at least twice using the same arm. Certain conditions can cause a difference in blood pressure between your right and left arms. Certain factors can cause blood pressure readings to be lower or higher than normal (elevated) for a short period of time:    When your blood pressure is higher when you are in a health care provider's office than when you are at home, this is called white coat hypertension. Most people with this condition do not need medicines.  When your blood pressure is higher at home than when you are in a health care provider's office, this is called masked hypertension. Most people with this condition may need medicines  to control blood pressure. If you have a high blood pressure reading during one visit or you have normal blood pressure with other risk factors:  You may be asked to return on a different day to have your blood pressure checked again.  You may be asked to monitor your blood pressure at home for 1 week or longer. If you are diagnosed with hypertension, you may have other blood or imaging tests to help your health care provider understand your overall risk for other conditions. How is this treated? This condition is treated by making healthy lifestyle changes, such as eating healthy foods, exercising more, and reducing your alcohol intake. Your health care provider may prescribe medicine if lifestyle changes are not enough to get your blood pressure under control, and if:  Your systolic blood pressure is above 130.  Your diastolic blood pressure is above 80. Your personal target blood pressure may vary depending on your medical conditions, your age, and other factors. Follow these instructions at home: Eating and drinking   Eat a diet that is high in fiber and potassium, and low in sodium, added sugar, and fat. An example eating plan is called the DASH (Dietary Approaches to Stop Hypertension) diet. To eat this way: ? Eat plenty of fresh fruits and vegetables. Try to fill half of your plate at each meal with fruits and vegetables. ? Eat whole grains, such as whole wheat pasta, brown rice, or whole grain bread. Fill about one quarter of your plate with whole grains. ? Eat or drink low-fat dairy products, such as skim milk or low-fat yogurt. ? Avoid fatty cuts of meat, processed or cured meats, and poultry with skin. Fill about one quarter of your plate with lean proteins, such as fish, chicken without skin, beans, eggs, and tofu. ? Avoid premade and processed foods. These tend to be higher in sodium, added sugar, and fat.  Reduce your daily sodium intake. Most people with hypertension should  eat less than 1,500 mg of sodium a day.  Limit alcohol intake to no more than 1 drink a day for nonpregnant women and 2 drinks a day for men. One drink equals 12 oz of beer, 5 oz of wine, or 1 oz of hard liquor. Lifestyle   Work with your health care provider to maintain a healthy body weight or to lose weight. Ask what an ideal weight is for you.  Get at least 30 minutes of exercise that causes your heart to beat faster (aerobic exercise) most days of the week. Activities may include walking, swimming, or biking.  Include exercise to strengthen your muscles (resistance exercise), such as pilates or lifting weights, as part of your weekly exercise routine. Try to do these types of exercises for 30 minutes at least 3 days a week.  Do not use any products that contain nicotine or tobacco, such as cigarettes and e-cigarettes. If you need help quitting, ask your health care provider.  Monitor your blood pressure at home as told by your health care provider.  Keep all follow-up visits as told by your health care provider.   This is important. Medicines  Take over-the-counter and prescription medicines only as told by your health care provider. Follow directions carefully. Blood pressure medicines must be taken as prescribed.  Do not skip doses of blood pressure medicine. Doing this puts you at risk for problems and can make the medicine less effective.  Ask your health care provider about side effects or reactions to medicines that you should watch for. Contact a health care provider if:  You think you are having a reaction to a medicine you are taking.  You have headaches that keep coming back (recurring).  You feel dizzy.  You have swelling in your ankles.  You have trouble with your vision. Get help right away if:  You develop a severe headache or confusion.  You have unusual weakness or numbness.  You feel faint.  You have severe pain in your chest or abdomen.  You vomit  repeatedly.  You have trouble breathing. Summary  Hypertension is when the force of blood pumping through your arteries is too strong. If this condition is not controlled, it may put you at risk for serious complications.  Your personal target blood pressure may vary depending on your medical conditions, your age, and other factors. For most people, a normal blood pressure is less than 120/80.  Hypertension is treated with lifestyle changes, medicines, or a combination of both. Lifestyle changes include weight loss, eating a healthy, low-sodium diet, exercising more, and limiting alcohol. This information is not intended to replace advice given to you by your health care provider. Make sure you discuss any questions you have with your health care provider. Document Released: 01/30/2005 Document Revised: 12/29/2015 Document Reviewed: 12/29/2015 Elsevier Interactive Patient Education  2019 Elsevier Inc.  

## 2018-06-24 NOTE — Assessment & Plan Note (Signed)
Well-controlled blood pressure.  Continue present medication.  Follow-up in 6 months.

## 2018-06-24 NOTE — Progress Notes (Addendum)
Kenneth Moore 78 y.o.   Chief Complaint  Patient presents with   Hypertension    f/u   Medication Refill    lisinopril    HISTORY OF PRESENT ILLNESS: This is a 78 y.o. male here for follow-up of hypertension on medication refill.  Patient takes lisinopril 10 mg a day.  Daughter helping with translation.  Patient doing well.  Has no complaints or medical concerns today. Health maintenance review reveals need for pneumococcal vaccine 23. Otherwise doing well with no complaints.  BP Readings from Last 3 Encounters:  06/24/18 116/75  04/17/17 (!) 150/73  11/07/16 128/66    HPI   Prior to Admission medications   Medication Sig Start Date End Date Taking? Authorizing Provider  lisinopril (ZESTRIL) 10 MG tablet TAKE 1 TABLET BY MOUTH EVERY DAY 06/03/18  Yes Ariyon Gerstenberger, Eilleen Kempf, MD  benzonatate (TESSALON) 200 MG capsule Take 1 capsule (200 mg total) by mouth 2 (two) times daily as needed for cough. Patient not taking: Reported on 06/24/2018 04/17/17   Georgina Quint, MD  doxycycline (VIBRA-TABS) 100 MG tablet Take 1 tablet (100 mg total) by mouth 2 (two) times daily. Patient not taking: Reported on 06/24/2018 04/17/17   Georgina Quint, MD  Pseudoephedrine-Guaifenesin [facility] D PO) Take by mouth as needed.    [provider]    No Known Allergies  Patient Active Problem List   Diagnosis Date Noted   Pain of both shoulder joints 11/07/2016   Arthritis, shoulder region 11/07/2016    Past Medical History:  Diagnosis Date   Hypertension     History reviewed. No pertinent surgical history.  Social History   Socioeconomic History   Marital status: Married    Spouse name: Not on file   Number of children: 3   Years of education: Not on file   Highest education level: Not on file  Occupational History   Not on file  Social Needs   Financial resource strain: Not on file   Food insecurity:    Worry: Not on file    Inability: Not on file    Transportation needs:    Medical: Not on file    Non-medical: Not on file  Tobacco Use   Smoking status: Never Smoker   Smokeless tobacco: Never Used  Substance and Sexual Activity   Alcohol use: No   Drug use: Not Currently   Sexual activity: Not Currently  Lifestyle   Physical activity:    Days per week: Not on file    Minutes per session: Not on file   Stress: Not on file  Relationships   Social connections:    Talks on phone: Not on file    Gets together: Not on file    Attends religious service: Not on file    Active member of club or organization: Not on file    Attends meetings of clubs or organizations: Not on file    Relationship status: Not on file   Intimate partner violence:    Fear of current or ex partner: Not on file    Emotionally abused: Not on file    Physically abused: Not on file    Forced sexual activity: Not on file  Other Topics Concern   Not on file  Social History Narrative   Not on file    History reviewed. No pertinent family history.   Review of Systems  Constitutional: Negative.  Negative for chills, fever and weight loss.  HENT: Negative.  Negative for  congestion, nosebleeds and sore throat.   Eyes: Negative.   Respiratory: Negative.  Negative for cough, hemoptysis and shortness of breath.   Cardiovascular: Negative.  Negative for chest pain, palpitations and leg swelling.  Gastrointestinal: Negative.  Negative for diarrhea, nausea and vomiting.  Genitourinary: Negative.  Negative for dysuria and hematuria.  Musculoskeletal: Negative.  Negative for myalgias.  Skin: Negative.  Negative for rash.  Neurological: Negative for dizziness.  Endo/Heme/Allergies: Negative.   All other systems reviewed and are negative.   Vitals:   06/24/18 1439  BP: 116/75  Pulse: (!) 115  Resp: 20  Temp: 98.3 F (36.8 C)  SpO2: 97%    Physical Exam Vitals signs reviewed.  Constitutional:      Appearance: Normal appearance.  HENT:      Head: Normocephalic.     Nose: Nose normal.     Mouth/Throat:     Mouth: Mucous membranes are moist.     Pharynx: Oropharynx is clear.  Eyes:     Extraocular Movements: Extraocular movements intact.     Conjunctiva/sclera: Conjunctivae normal.     Pupils: Pupils are equal, round, and reactive to light.  Cardiovascular:     Rate and Rhythm: Normal rate and regular rhythm.     Pulses: Normal pulses.     Heart sounds: Normal heart sounds.     Comments: Repeat heart rate: 88 and regular Pulmonary:     Effort: Pulmonary effort is normal.     Breath sounds: Normal breath sounds.  Abdominal:     General: Abdomen is flat. Bowel sounds are normal. There is no distension.     Tenderness: There is no abdominal tenderness.  Musculoskeletal: Normal range of motion.  Skin:    General: Skin is warm and dry.     Capillary Refill: Capillary refill takes less than 2 seconds.  Neurological:     General: No focal deficit present.     Mental Status: He is alert and oriented to person, place, and time.     Sensory: No sensory deficit.     Motor: No weakness.     Coordination: Coordination normal.  Psychiatric:        Mood and Affect: Mood normal.        Behavior: Behavior normal.    Lab Results  Component Value Date   CREATININE 1.10 04/17/2017   BUN 15 04/17/2017   NA 140 04/17/2017   K 4.0 04/17/2017   CL 103 04/17/2017   CO2 20 04/17/2017   No results found for: CHOL, HDL, LDLCALC, LDLDIRECT, TRIG, CHOLHDL  The 10-year ASCVD risk score Denman George DC Jr., et al., 2013) is: 32.4%   Values used to calculate the score:     Age: 33 years     Sex: Male     Is Non-Hispanic African American: No     Diabetic: No     Tobacco smoker: No     Systolic Blood Pressure: 116 mmHg     Is BP treated: Yes     HDL Cholesterol: 37 mg/dL     Total Cholesterol: 293 mg/dL  ASSESSMENT & PLAN: Essential hypertension Well-controlled blood pressure.  Continue present medication.  Follow-up in 6  months.  Fenix was seen today for hypertension and medication refill.  Diagnoses and all orders for this visit:  Essential hypertension -     lisinopril (ZESTRIL) 10 MG tablet; Take 1 tablet (10 mg total) by mouth daily. -     Comprehensive metabolic panel -  Hemoglobin A1c -     Lipid panel  Need for Streptococcus pneumoniae vaccination -     Pneumococcal polysaccharide vaccine 23-valent greater than or equal to 2yo subcutaneous/IM   A total of 25 minutes was spent in the room with the patient, greater than 50% of which was in counseling/coordination of care regarding hypertension and associated cardiovascular risks, importance of diet and nutrition, physical activity, need for blood work to assess renal function as well as diabetic screening, and need for follow-up.   Patient Instructions       If you have lab work done today you will be contacted with your lab results within the next 2 weeks.  If you have not heard from [place] then please contact us. The fastest way to get your results is to register for My Chart.   IF you received an x-ray today, you will receive an invoice from [facility]. Please contact [facility] at [phone] with questions or concerns regarding your invoice.   IF you received labwork today, you will receive an invoice from [facility]. Please contact [facility] at [phone] with questions or concerns regarding your invoice.   Our billing staff will not be able to assist you with questions regarding bills from these companies.  You will be contacted with the lab results as soon as they are available. The fastest way to get your results is to activate your My Chart account. Instructions are located on the last page of this paperwork. If you have not heard from [place] regarding the results in 2 weeks, please contact this office.     Hypertension Hypertension, commonly called high blood pressure, is when the force of blood pumping through the  arteries is too strong. The arteries are the blood vessels that carry blood from the heart throughout the body. Hypertension forces the heart to work harder to pump blood and may cause arteries to become narrow or stiff. Having untreated or uncontrolled hypertension can cause heart attacks, strokes, kidney disease, and other problems. A blood pressure reading consists of a higher number over a lower number. Ideally, your blood pressure should be below 120/80. The first ("top") number is called the systolic pressure. It is a measure of the pressure in your arteries as your heart beats. The second ("bottom") number is called the diastolic pressure. It is a measure of the pressure in your arteries as the heart relaxes. What are the causes? The cause of this condition is not known. What increases the risk? Some risk factors for high blood pressure are under your control. Others are not. Factors you can change  Smoking.  Having type 2 diabetes mellitus, high cholesterol, or both.  Not getting enough exercise or physical activity.  Being overweight.  Having too much fat, sugar, calories, or salt (sodium) in your diet.  Drinking too much alcohol. Factors that are difficult or impossible to change  Having chronic kidney disease.  Having a family history of high blood pressure.  Age. Risk increases with age.  Race. You may be at higher risk if you are African-American.  Gender. Men are at higher risk than women before age 52. After age 35, women are at higher risk than men.  Having obstructive sleep apnea.  Stress. What are the signs or symptoms? Extremely high blood pressure (hypertensive crisis) may cause:  Headache.  Anxiety.  Shortness of breath.  Nosebleed.  Nausea and vomiting.  Severe chest pain.  Jerky movements you cannot control (seizures). How is this diagnosed? This  condition is diagnosed by measuring your blood pressure while you are seated, with your arm  resting on a surface. The cuff of the blood pressure monitor will be placed directly against the skin of your upper arm at the level of your heart. It should be measured at least twice using the same arm. Certain conditions can cause a difference in blood pressure between your right and left arms. Certain factors can cause blood pressure readings to be lower or higher than normal (elevated) for a short period of time:  When your blood pressure is higher when you are in a health care provider's office than when you are at home, this is called white coat hypertension. Most people with this condition do not need medicines.  When your blood pressure is higher at home than when you are in a health care provider's office, this is called masked hypertension. Most people with this condition may need medicines to control blood pressure. If you have a high blood pressure reading during one visit or you have normal blood pressure with other risk factors:  You may be asked to return on a different day to have your blood pressure checked again.  You may be asked to monitor your blood pressure at home for 1 week or longer. If you are diagnosed with hypertension, you may have other blood or imaging tests to help your health care provider understand your overall risk for other conditions. How is this treated? This condition is treated by making healthy lifestyle changes, such as eating healthy foods, exercising more, and reducing your alcohol intake. Your health care provider may prescribe medicine if lifestyle changes are not enough to get your blood pressure under control, and if:  Your systolic blood pressure is above 130.  Your diastolic blood pressure is above 80. Your personal target blood pressure may vary depending on your medical conditions, your age, and other factors. Follow these instructions at home: Eating and drinking   Eat a diet that is high in fiber and potassium, and low in sodium, added  sugar, and fat. An example eating plan is called the DASH (Dietary Approaches to Stop Hypertension) diet. To eat this way: ? Eat plenty of fresh fruits and vegetables. Try to fill half of your plate at each meal with fruits and vegetables. ? Eat whole grains, such as whole wheat pasta, brown rice, or whole grain bread. Fill about one quarter of your plate with whole grains. ? Eat or drink low-fat dairy products, such as skim milk or low-fat yogurt. ? Avoid fatty cuts of meat, processed or cured meats, and poultry with skin. Fill about one quarter of your plate with lean proteins, such as fish, chicken without skin, beans, eggs, and tofu. ? Avoid premade and processed foods. These tend to be higher in sodium, added sugar, and fat.  Reduce your daily sodium intake. Most people with hypertension should eat less than 1,500 mg of sodium a day.  Limit alcohol intake to no more than 1 drink a day for nonpregnant women and 2 drinks a day for men. One drink equals 12 oz of beer, 5 oz of wine, or 1 oz of hard liquor. Lifestyle   Work with your health care provider to maintain a healthy body weight or to lose weight. Ask what an ideal weight is for you.  Get at least 30 minutes of exercise that causes your heart to beat faster (aerobic exercise) most days of the week. Activities may include walking, swimming, or  biking.  Include exercise to strengthen your muscles (resistance exercise), such as pilates or lifting weights, as part of your weekly exercise routine. Try to do these types of exercises for 30 minutes at least 3 days a week.  Do not use any products that contain nicotine or tobacco, such as cigarettes and e-cigarettes. If you need help quitting, ask your health care provider.  Monitor your blood pressure at home as told by your health care provider.  Keep all follow-up visits as told by your health care provider. This is important. Medicines  Take over-the-counter and prescription medicines  only as told by your health care provider. Follow directions carefully. Blood pressure medicines must be taken as prescribed.  Do not skip doses of blood pressure medicine. Doing this puts you at risk for problems and can make the medicine less effective.  Ask your health care provider about side effects or reactions to medicines that you should watch for. Contact a health care provider if:  You think you are having a reaction to a medicine you are taking.  You have headaches that keep coming back (recurring).  You feel dizzy.  You have swelling in your ankles.  You have trouble with your vision. Get help right away if:  You develop a severe headache or confusion.  You have unusual weakness or numbness.  You feel faint.  You have severe pain in your chest or abdomen.  You vomit repeatedly.  You have trouble breathing. Summary  Hypertension is when the force of blood pumping through your arteries is too strong. If this condition is not controlled, it may put you at risk for serious complications.  Your personal target blood pressure may vary depending on your medical conditions, your age, and other factors. For most people, a normal blood pressure is less than 120/80.  Hypertension is treated with lifestyle changes, medicines, or a combination of both. Lifestyle changes include weight loss, eating a healthy, low-sodium diet, exercising more, and limiting alcohol. This information is not intended to replace advice given to you by your health care provider. Make sure you discuss any questions you have with your health care provider. Document Released: 01/30/2005 Document Revised: 12/29/2015 Document Reviewed: 12/29/2015 Elsevier Interactive Patient Education  2019 [employer]vier Inc.       Edwina Barth, MD [facility]

## 2018-06-25 ENCOUNTER — Telehealth: Payer: Self-pay | Admitting: Emergency Medicine

## 2018-06-25 LAB — LIPID PANEL
Chol/HDL Ratio: 7.9 ratio — ABNORMAL HIGH (ref 0.0–5.0)
Cholesterol, Total: 293 mg/dL — ABNORMAL HIGH (ref 100–199)
HDL: 37 mg/dL — ABNORMAL LOW (ref >39)
Triglycerides: 473 mg/dL — ABNORMAL HIGH (ref 0–149)

## 2018-06-25 LAB — COMPREHENSIVE METABOLIC PANEL
ALT: 26 IU/L (ref 0–44)
AST: 22 IU/L (ref 0–40)
Albumin/Globulin Ratio: 1.7 (ref 1.2–2.2)
Albumin: 4.3 g/dL (ref 3.7–4.7)
Alkaline Phosphatase: 88 IU/L (ref 39–117)
BUN/Creatinine Ratio: 10 (ref 10–24)
BUN: 14 mg/dL (ref 8–27)
Bilirubin Total: 0.3 mg/dL (ref 0.0–1.2)
CO2: 21 mmol/L (ref 20–29)
Calcium: 9.2 mg/dL (ref 8.6–10.2)
Chloride: 103 mmol/L (ref 96–106)
Creatinine, Ser: 1.35 mg/dL — ABNORMAL HIGH (ref 0.76–1.27)
GFR calc Af Amer: 58 mL/min/{1.73_m2} — ABNORMAL LOW (ref >59)
GFR calc non Af Amer: 50 mL/min/{1.73_m2} — ABNORMAL LOW (ref >59)
Globulin, Total: 2.5 g/dL (ref 1.5–4.5)
Glucose: 204 mg/dL — ABNORMAL HIGH (ref 65–99)
Potassium: 4.1 mmol/L (ref 3.5–5.2)
Sodium: 137 mmol/L (ref 134–144)
Total Protein: 6.8 g/dL (ref 6.0–8.5)

## 2018-06-25 LAB — HEMOGLOBIN A1C
Est. average glucose Bld gHb Est-mCnc: 126 mg/dL
Hgb A1c MFr Bld: 6 % — ABNORMAL HIGH (ref 4.8–5.6)

## 2018-06-25 MED ORDER — ROSUVASTATIN CALCIUM 10 MG PO TABS: 10.0000 mg | ORAL_TABLET | Freq: Every day | ORAL | 3 refills | Status: DC

## 2018-06-25 NOTE — Addendum Note (Signed)
Addended by: Evie Lacks on: 06/25/2018 01:00 PM   Modules accepted: Orders

## 2018-06-25 NOTE — Telephone Encounter (Signed)
06/25/2018 - PATIENT HAD AN OFFICE VISIT WITH DR. Irving Shows ON Monday (06/24/2018) FOR HIS BLOOD PRESSURE. DR. Irving Shows HAS REQUESTED HE RETURN FOR A FOLLOW-UP IN 6 MONTHS (NOV. 2020). I TRIED TO CALL AND SCHEDULE BUT HAD TO LEAVE A VOICE MAIL TO RETURN OUR CALL. MBC

## 2018-10-18 IMAGING — DX DG CHEST 2V
2 series · 2 of 2 positions shown · non-contrast
Comparison: None in PACs

CLINICAL DATA: Cough.  Nonsmoker.

EXAM:
CHEST  2 VIEW

[chest pa]
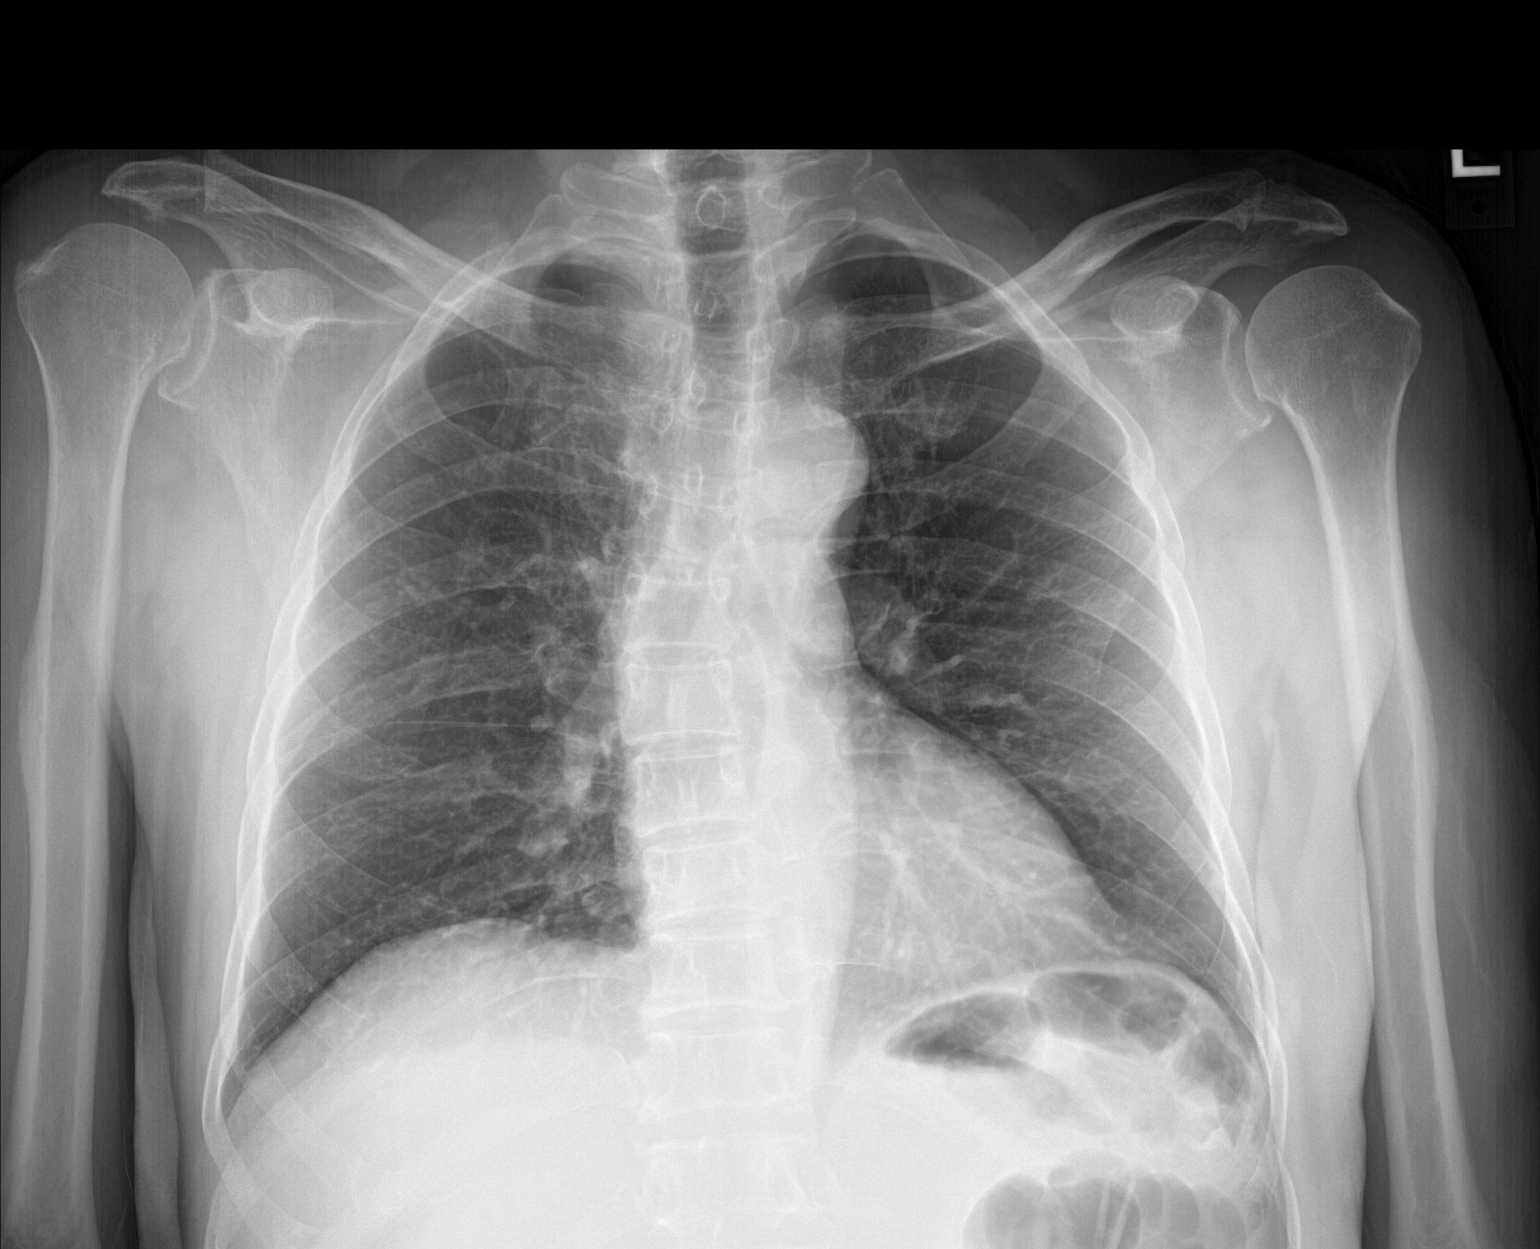

[chest lat]
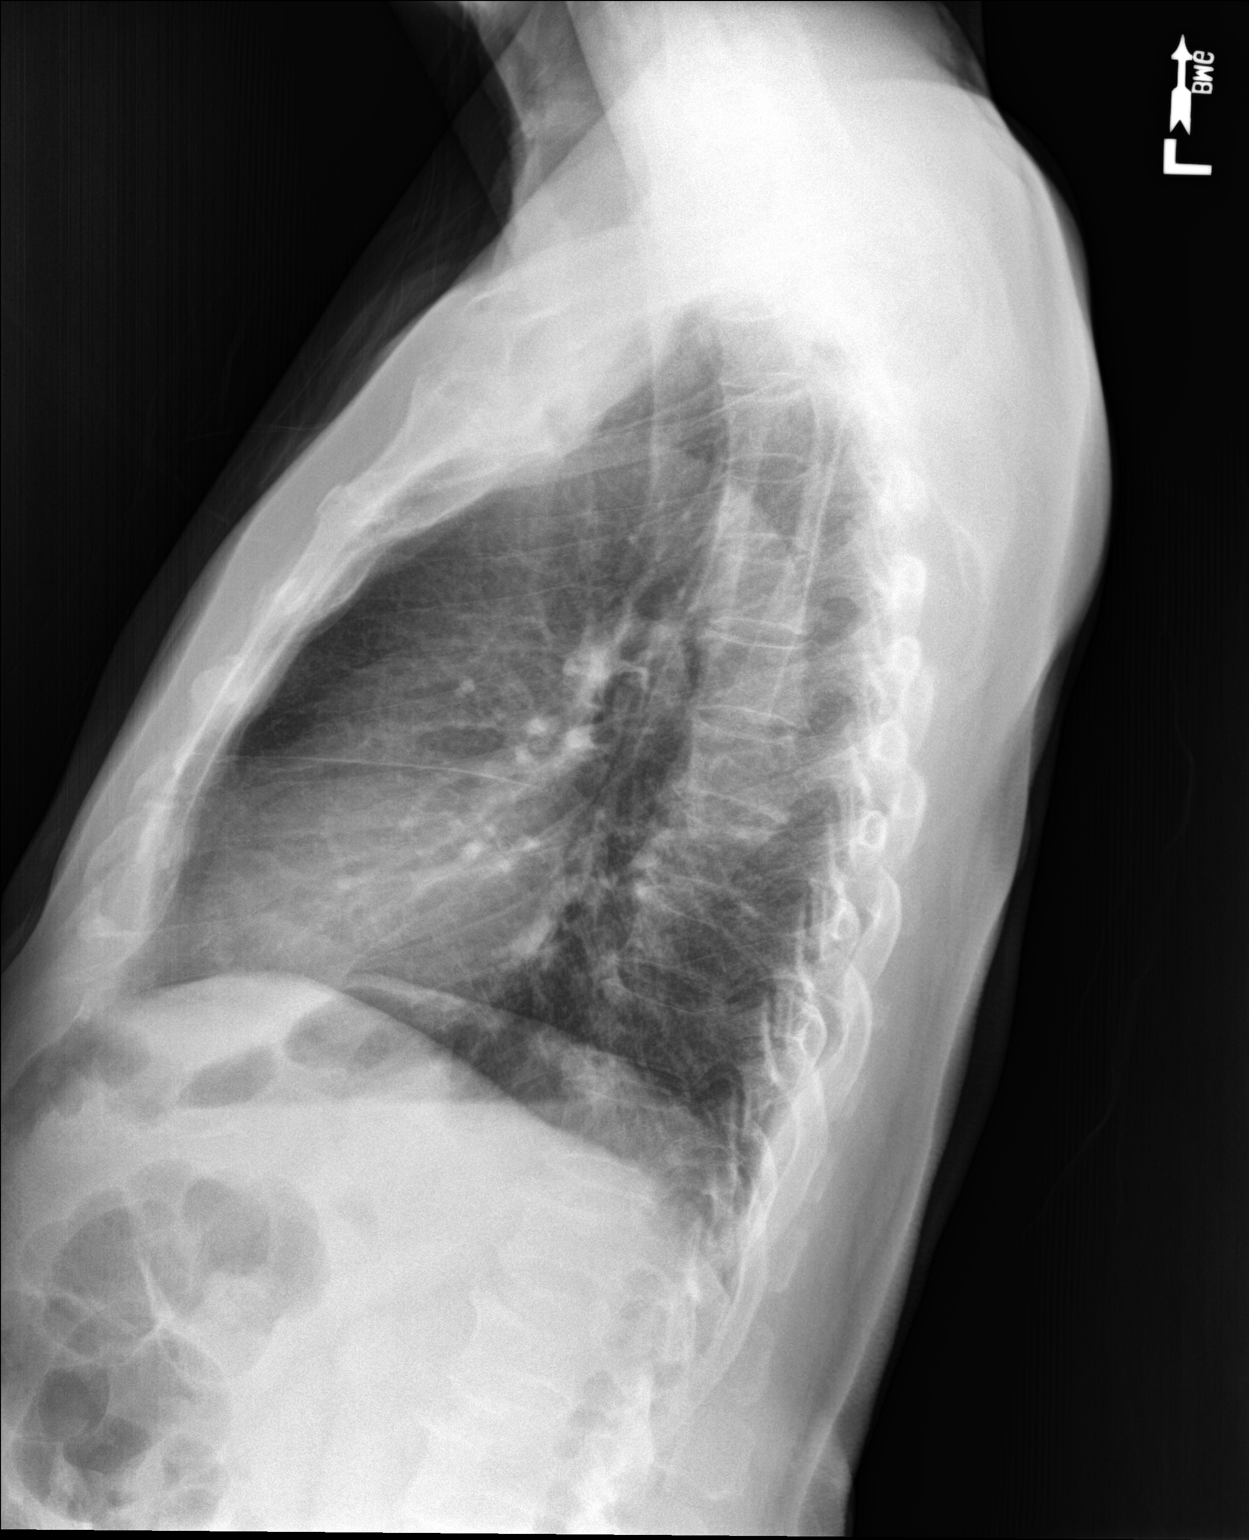

[2 of 2 positions shown; findings below may reference images not displayed]

FINDINGS: The lungs are adequately inflated. The interstitial markings are
coarse. There is no alveolar infiltrate or pleural effusion. The
heart and pulmonary vascularity are normal. There is gentle
dextrocurvature centered in the midthoracic spine.
IMPRESSION: Mild interstitial prominence bilaterally likely reflects chronic
bronchitic changes. No alveolar pneumonia nor CHF.

## 2018-11-01 DIAGNOSIS — R69 Illness, unspecified: Secondary | ICD-10-CM | POA: Diagnosis not present

## 2018-12-30 ENCOUNTER — Ambulatory Visit: Payer: Medicare HMO | Admitting: Emergency Medicine

## 2019-01-02 ENCOUNTER — Encounter: Payer: Self-pay | Admitting: Emergency Medicine

## 2019-01-02 ENCOUNTER — Other Ambulatory Visit: Payer: Self-pay

## 2019-01-02 ENCOUNTER — Ambulatory Visit (INDEPENDENT_AMBULATORY_CARE_PROVIDER_SITE_OTHER): Payer: Medicare HMO | Admitting: Emergency Medicine

## 2019-01-02 VITALS — BP 152/85 | HR 97 | Temp 98.2°F | Resp 16 | Ht 62.99 in | Wt 131.0 lb

## 2019-01-02 DIAGNOSIS — I10 Essential (primary) hypertension: Secondary | ICD-10-CM

## 2019-01-02 DIAGNOSIS — E785 Hyperlipidemia, unspecified: Secondary | ICD-10-CM | POA: Insufficient documentation

## 2019-01-02 DIAGNOSIS — R7303 Prediabetes: Secondary | ICD-10-CM | POA: Insufficient documentation

## 2019-01-02 MED ORDER — LISINOPRIL 20 MG PO TABS: 20.0000 mg | ORAL_TABLET | Freq: Every day | ORAL | 3 refills | Status: DC

## 2019-01-02 MED ORDER — ROSUVASTATIN CALCIUM 10 MG PO TABS: 10.0000 mg | ORAL_TABLET | Freq: Every day | ORAL | 3 refills | Status: DC

## 2019-01-02 NOTE — Assessment & Plan Note (Signed)
Lipid profile done today.  Continue Crestor 10 mg daily.  Follow-up in 6 months.

## 2019-01-02 NOTE — Assessment & Plan Note (Signed)
Elevated blood pressure.  Will increase lisinopril to 20 mg a day.  Blood work done today.  Follow-up in 6 months.

## 2019-01-02 NOTE — Progress Notes (Signed)
BP Readings from Last 3 Encounters:  06/24/18 116/75  04/17/17 (!) 150/73  11/07/16 128/66   Lab Results  Component Value Date   CREATININE 1.35 (H) 06/24/2018   BUN 14 06/24/2018   NA 137 06/24/2018   K 4.1 06/24/2018   CL 103 06/24/2018   CO2 21 06/24/2018   Lab Results  Component Value Date   CHOL 293 (H) 06/24/2018   HDL 37 (L) 06/24/2018   LDLCALC Comment 06/24/2018   TRIG 473 (H) 06/24/2018   CHOLHDL 7.9 (H) 06/24/2018   Kenneth Moore 78 y.o.   Chief Complaint  Patient presents with  . Diabetes    6 month follow up   . Hypertension    HISTORY OF PRESENT ILLNESS: This is a 78 y.o. male with history of hypertension, dyslipidemia, and prediabetes here for follow-up and medication refill. Has no complaints or medical concerns today. HPI   Prior to Admission medications   Medication Sig Start Date End Date Taking? Authorizing Provider  benzonatate (TESSALON) 200 MG capsule Take 1 capsule (200 mg total) by mouth 2 (two) times daily as needed for cough. Patient not taking: Reported on 06/24/2018 04/17/17   Georgina Quint, MD  doxycycline (VIBRA-TABS) 100 MG tablet Take 1 tablet (100 mg total) by mouth 2 (two) times daily. Patient not taking: Reported on 06/24/2018 04/17/17   Georgina Quint, MD  lisinopril (ZESTRIL) 10 MG tablet Take 1 tablet (10 mg total) by mouth daily. 06/24/18 09/22/18  Georgina Quint, MD  Pseudoephedrine-Guaifenesin [facility] PO) Take by mouth as needed.    [provider]  rosuvastatin (CRESTOR) 10 MG tablet Take 1 tablet (10 mg total) by mouth daily. 06/25/18   Georgina Quint, MD    No Known Allergies  Patient Active Problem List   Diagnosis Date Noted  . Essential hypertension 06/24/2018  . Arthritis, shoulder region 11/07/2016    Past Medical History:  Diagnosis Date  . Hypertension     History reviewed. No pertinent surgical history.  Social History   Socioeconomic History  . Marital status: Married     Spouse name: Not on file  . Number of children: 3  . Years of education: Not on file  . Highest education level: Not on file  Occupational History  . Not on file  Social Needs  . Financial resource strain: Not on file  . Food insecurity    Worry: Not on file    Inability: Not on file  . Transportation needs    Medical: Not on file    Non-medical: Not on file  Tobacco Use  . Smoking status: Never Smoker  . Smokeless tobacco: Never Used  Substance and Sexual Activity  . Alcohol use: No  . Drug use: Not Currently  . Sexual activity: Not Currently  Lifestyle  . Physical activity    Days per week: Not on file    Minutes per session: Not on file  . Stress: Not on file  Relationships  . Social Musician on phone: Not on file    Gets together: Not on file    Attends religious service: Not on file    Active member of club or organization: Not on file    Attends meetings of clubs or organizations: Not on file    Relationship status: Not on file  . Intimate partner violence    Fear of current or ex partner: Not on file    Emotionally abused: Not on file  Physically abused: Not on file    Forced sexual activity: Not on file  Other Topics Concern  . Not on file  Social History Narrative  . Not on file    History reviewed. No pertinent family history.   Review of Systems  Constitutional: Negative.  Negative for chills and fever.  HENT: Negative.  Negative for congestion and sore throat.   Respiratory: Negative.  Negative for cough and shortness of breath.   Cardiovascular: Negative.  Negative for chest pain and palpitations.  Gastrointestinal: Negative.  Negative for abdominal pain, blood in stool, diarrhea, nausea and vomiting.  Genitourinary: Negative.  Negative for dysuria and hematuria.  Musculoskeletal: Negative.  Negative for back pain, myalgias and neck pain.  Skin: Negative.  Negative for rash.  Neurological: Negative for dizziness and headaches.   All other systems reviewed and are negative.  Vitals:   01/02/19 1357  BP: (!) 152/85  Pulse: 97  Resp: 16  Temp: 98.2 F (36.8 C)  SpO2: 97%     Physical Exam Vitals signs reviewed.  Constitutional:      Appearance: Normal appearance.  HENT:     Head: Normocephalic.  Eyes:     Extraocular Movements: Extraocular movements intact.     Conjunctiva/sclera: Conjunctivae normal.     Pupils: Pupils are equal, round, and reactive to light.  Neck:     Musculoskeletal: Normal range of motion and neck supple.  Cardiovascular:     Rate and Rhythm: Normal rate and regular rhythm.     Pulses: Normal pulses.     Heart sounds: Normal heart sounds.  Pulmonary:     Effort: Pulmonary effort is normal.     Breath sounds: Normal breath sounds.  Musculoskeletal: Normal range of motion.  Skin:    General: Skin is warm and dry.     Capillary Refill: Capillary refill takes less than 2 seconds.  Neurological:     General: No focal deficit present.     Mental Status: He is alert and oriented to person, place, and time.  Psychiatric:        Mood and Affect: Mood normal.        Behavior: Behavior normal.     A total of 25 minutes was spent in the room with the patient, greater than 50% of which was in counseling/coordination of care regarding chronic medical problems and cardiovascular risks associated with these, management, medication review and adjustment of doses, diet and nutrition, COVID-19 precautions, blood work, prognosis and need for follow-up.   ASSESSMENT & PLAN: Essential hypertension Elevated blood pressure.  Will increase lisinopril to 20 mg a day.  Blood work done today.  Follow-up in 6 months.  Dyslipidemia Lipid profile done today.  Continue Crestor 10 mg daily.  Follow-up in 6 months.  Kenneth Moore was seen today for diabetes and hypertension.  Diagnoses and all orders for this visit:  Essential hypertension -     Comprehensive metabolic panel -     lisinopril (ZESTRIL) 20  MG tablet; Take 1 tablet (20 mg total) by mouth daily.  Dyslipidemia -     Lipid panel -     rosuvastatin (CRESTOR) 10 MG tablet; Take 1 tablet (10 mg total) by mouth daily.  Prediabetes -     Hemoglobin A1c    Patient Instructions       If you have lab work done today you will be contacted with your lab results within the next 2 weeks.  If you have not heard from  [place] then please contact us. The fastest way to get your results is to register for My Chart.   IF you received an x-ray today, you will receive an invoice from [facility]. Please contact [facility] at [phone] with questions or concerns regarding your invoice.   IF you received labwork today, you will receive an invoice from [facility]. Please contact [facility] at [phone] with questions or concerns regarding your invoice.   Our billing staff will not be able to assist you with questions regarding bills from these companies.  You will be contacted with the lab results as soon as they are available. The fastest way to get your results is to activate your My Chart account. Instructions are located on the last page of this paperwork. If you have not heard from [place] regarding the results in 2 weeks, please contact this office.     T?ng huy?t p, Ng??i l?n Hypertension, Adult Huy?t p cao (t?ng huy?t p) l khi l?c b?m mu qua ??ng m?ch qu m?nh. ??ng m?ch l cc m?ch mu mang mu t? tim ?i kh?p c? th?. T?ng huy?t p khi?n tim lm vi?c v?t v? h?n ?? b?m mu v c th? khi?n cc ??ng m?ch tr? ln h?p ho?c c?ng. T?ng huy?t p khng ???c ?i?u tr? ho?c ki?m sot c th? d?n t?i nh?i mu c? tim, suy tim, ??t qu?, b?nh th?n v nh?ng v?n ?? khc. Ch? s? ?o huy?t p g?m m?t ch? s? cao trn m?t ch? s? th?p. L t??ng l huy?t a?p cu?a quy? vi? c?n ph?i d??i 120/80. Ch? s? ??u tin ("??nh") ???c g?i l huy?t p tm thu. ?y l s? ?o p su?t trong ??ng m?ch khi tim qu v? ??p. Ch? s? th? hai ("?y") ???c g?i l huy?t p  tm tr??ng. ?y l s? ?o p su?t trong ??ng m?ch khi tim qu v? ngh?[place] nhn g gy ra? Khng r nguyn nhn chnh xc gy ra tnh tr?ng ny. C m?t s? tnh tr?ng d?n ??n ho?c c lin quan ??n huy?t p cao. ?i?u g lm t?ng nguy c?? M?t s? y?u t? nguy c? d?n ??n huy?t p cao c th? ki?m sot ???c. Nh?ng y?u t? sau c th? lm qu v? d? b? tnh tr?ng ny h?n:  Ht thu?c.  B? b?nh ti?u ???ng tup 2, cholesterol cao, ho?c c? hai.  Khng t?p th? d?c ho?c cc ho?t ??ng th? ch?t ??y ??Marland Kitchen  Th?a cn.  ?n qu nhi?u ch?t bo, ???ng, ca-lo, ho?c mu?i (Natri).  U?ng qu nhi?u r??u. M?t s? y?u t? nguy c? b? huy?t p cao c th? kh ho?c khng th? thay ??i. M?t s? y?u t? trong s? ny bao g?m:  B?nh th?n m?n tnh.  C ti?n s? gia ?nh b? cao huy?t p.  ?? tu?i. Nguy c? t?ng ln theo ?? tu?i.  Ch?ng t?c. Qu v? c nguy c? cao h?n n?u qu v? l ng??i M? g?c Phi.  Gi?i tnh. Nam gi?i c nguy c? cao h?n ph? n? tr??c tu?i 46. Sau tu?i 66, ph? n? c nguy c? cao h?n nam gi?i.  Ng?ng th? khi ng? do t?c ngh?n.  C?ng th?ng. Cc d?u hi?u ho?c tri?u ch?ng g? Huy?t p cao c th? khng gy ra cc tri?u ch?ng. Huy?t p r?t cao (c?n t?ng huy?t p) c th? gy ra:  ?au ??u.  Lo u.  Kh th?.  Ch?y mu cam.  Bu?n nn v nn m?a.  Th? l?c thay ??i.  ?  au ng?c d? d?i.  Co gi?t. Ch?n ?on tnh tr?ng ny nh? th? no? Tnh tr?ng ny ???c ch?n ?on b?ng cch ?o huy?t p c?a qu v? lc qu v? ng?i, ?? tay trn m?t b? m?t ph?ng, hai chn khng b?t cho, v hai bn chn b?ng ph?ng trn sn. B?ng qu?n thi?t b? ?o huy?t p s? ???c qu?n tr?c ti?p vo vng da cnh tay pha trn c?a qu v? ngang v?i m?c tim. Huy?t p c?n ???c ?o t nh?t hai l?n trn cng m?t cnh tay. M?t s? tnh tr?ng nh?t ??nh c th? lm cho huy?t p khc nhau gi?a tay ph?i v tay tri c?a qu v?. M?t s? y?u t? nh?t ??nh c th? khi?n ch? s? ?o huy?t p th?p h?n ho?c cao h?n so v?i bnh th??ng trong th?i gian ng?n:  Khi huy?t p c?a qu v?  ? phng khm c?a chuyn gia ch?m [place] s?c kh?e cao h?n so v?i lc qu v? ? nh, hi?n t??ng ny ???c g?i l t?ng huy?t p o chong tr?ng. H?u h?t nh?ng ng??i b? tnh tr?ng ny ??u khng c?n dng thu?c.  Khi huy?t p c?a qu v? lc ? nh cao h?n so v?i lc qu v? ? phng khm chuyn gia ch?m [place] s?c kh?e, hi?n t??ng ny ???c g?i l t?ng huy?t p ?n. H?u h?t nh?ng ng??i b? tnh tr?ng ny ??u c th? c?n dng thu?c ?? ki?m sot huy?t p. N?u qu v? c ch? s? huy?t p cao trong m?t l?n khm ho?c qu v? c huy?t p bnh th??ng c km cc y?u t? nguy c? khc, qu v? c th? ???c yu c?u:  Tr? l?i vo m?t ngy khc ?? ki?m tra l?i huy?t p.  Theo di huy?t p t?i nh trong vng 1 tu?n ho?c lu h?n. N?u qu v? ???c ch?n ?on b? t?ng huy?t p, qu v? c th? c?n th?c hi?n cc xt nghi?m mu ho?c ki?m tra hnh ?nh khc ?? gip chuyn gia ch?m [place] s?c kh?e hi?u nguy c? t?ng th? m?c cc b?nh tr?ng khc. Tnh tr?ng ny ???c ?i?u tr? nh? th? no? Tnh tr?ng ny ???c ?i?u tr? b?ng cch thay ??i l?i s?ng lnh m?nh, ch?ng h?nh nh? ?n th?c ph?m c l?i cho s?c kh?e, t?p th? d?c nhi?u h?n v gi?m l??ng r??u u?ng vo. N?u thay ??i l?i s?ng khng ?? ?? ??a huy?t p v? m?c c th? ki?m sot ???c, chuyn gia ch?m [place] s?c kh?e c th? k ??n thu?c, v n?u:  Huy?t p tm thu c?a qu v? trn 130.  Huy?t p tm tr??ng c?a qu v? trn 80. Huy?t p m?c tiu c nhn c?a qu v? c th? khc nhau ty thu?c v tnh tr?ng b?nh l, tu?i v cc nhn t? khc. Tun th? nh?ng h??ng d?n ny ? nh: ?n v u?ng   ?n ch? ?? giu ch?t x? v kali v t natri, ???ng ph? gia v ch?t bo. M?t k? ho?ch ?n m?u c tn l ch? ?? ?n DASH (Cch ti?p c?n ch? ?? ?n u?ng ?? ng?n ch?n t?ng huy?t p). ?n theo cch ny: ? ?n nhi?u tri cy v rau t??i. Vo m?i b?a ?n, c? g?ng dnh m?t n?a ??a cho tri cy v rau c?. ? ?n ng? c?c nguyn cm, ch?ng h?n nh? m ?ng lm t? b?t m nguyn cm, g?o l?t, ho?c bnh m t? b?t m nguyn cm. Cho ngu? c?c nguyn ca?m va?o  kho?ng m?t ph?n  t? ??a. ? ?n ho?c hu?ng cc s?n ph?m t? s?a t bo, ch?ng h?n nh? s?a ? b? kem ho?c s?a chua t bo. ? Trnh nh?ng mi?ng th?t nhi?u m?, th?t ? qua ch? bi?n ho?c th?t ??p mu?i v th?t gia c?m c da. Dnh kho?ng m?t ph?n t? ??a c?a qu v? cho cc protein khng m?, ch?ng h?n nh? c, th?t g khng da, ??u, tr?ng, ho?c ??u ph?. ? Trnh nh?ng th?c ph?m ch? bi?n ho?c lm s?n. Nh?ng th?c ph?m ny th??ng c nhi?u natri, ???ng ph? gia v ch?t bo h?n.  Gi?m l??ng dng natri hng ngy c?a qu v?. H?u h?t nh?ng ng??i b? t?ng huy?t p ??u nn ?n d??i 1.500 mg natri m?i ngy.  Khng u?ng r??u n?u: ? Chuyn gia ch?m [place] s?c kh?e c?a qu v? khuyn qu v? khng u?ng r??u. ? Qu v? c [place], c th? c [place], ho?c c k? ho?ch c [place].  N?u qu v? u?ng r??u: ? Gi?i h?n l??ng r??u qu v? u?ng ? m?c:  0-1 ly/ngy ??i v?i n? gi?i.  0-2 ly/ngy ??i v?i nam gi?i. ? Bi?t r m?t ly c bao nhiu r??u. ? M?, m?t ly t??ng ???ng v?i m?t chai bia 12 ao x? (355 mL), m?t ly r??u vang 5 ao x? (148 mL), ho?c m?t ly r??u m?nh 1 ao x? (44 mL). L?i s?ng   H?p tc v?i chuyn gia ch?m [place] s?c kh?e c?a qu v? ?? duy tr tr?ng l??ng c? th? c l?i cho s?c kh?e ho?c ?? gi?m cn. Hy h?i xem tr?ng l??ng no l l t??ng cho qu v?.  T?p th? d?c t nh?t 30 pht h?u h?t cc ngy trong tu?n. Cc ho?t ??ng c th? bao g?m ?i b?, b?i, ho?c ??p xe.  Bao g?m bi t?p t?ng c??ng c? (bi t?p khng l?c), ch?ng h?n nh? bi t?p [place] ho?c nng t?, nh? m?t ph?n c?a thi quen luy?n t?p hng tu?n c?a qu v?. C? g?ng t?p nh?ng lo?i bi t?p ny trong vng 30 pht t?i thi?u 3 ngy m?i tu?n.  Khng s? d?ng b?t k? s?n ph?m no c nicotine ho?c thu?c l, ch?ng ha?n nh? thu?c l d?ng ht, thu?c l ?i?n t? v thu?c l d?ng nhai. N?u qu v? c?n gip ?? ?? cai thu?c, hy h?i chuyn gia ch?m [place] s?c kh?e.  Theo di huy?t p c?a qu v? t?i nh theo h??ng d?n c?a chuyn gia ch?m [place] s?c kh?e.  Tun th? t?t c? cc l?n khm theo di theo ch?  d?n c?a chuyn gia ch?m [place] s?c kh?e. ?i?u ny c vai tr quan tr?ng. Thu?c  Ch? s? d?ng thu?c khng k ??n v thu?c k ??n theo ch? d?n c?a chuyn gia ch?m [name] s?c kh?e. Lm theo ch? d?n m?t cch c?n th?n. Thu?c ?i?u tr? huy?t p ph?i ???c dng theo ??n ? k.  Khng b? cc li?u thu?c huy?t p. Vi?c b? dng thu?c khi?n qu v? c nguy c? g?p ph?i cc v?n ?? v c th? lm cho thu?c gi?m hi?u qu?Marland Kitchen.  Hy h?i chuyn gia ch?m [place] s?c kh?e c?a qu v? v? nh?ng tc d?ng ph? ho?c ph?n ?ng v?i thu?c m qu v? ph?i theo di. Hy lin l?c v?i chuyn gia ch?m [place] s?c kh?e n?u qu v?:  Ngh? qu v? c ph?n ?ng v?i thu?c ?ang dng.  B? ?au ??u ti?p t?c tr? l?i (ti pht).  C?m th?y chng m?t.  B? s?ng ph ?  c? chn.  C v?n ?? v? th? l?c. Yu c?u tr? gip ngay l?p t?c n?u qu v?:  B? ?au ??u r?t nhi?u ho?c l l?n.  B? y?u ho?c t b b?t th??ng.  C?m th?y ng?t x?u.  B? ?au r?t nhi?u ? ng?c ho?c b?ng.  Nn nhi?u l?n.  B? kh th?. Tm t?t  T?ng huy?t p l khi l?c b?m mu qua cc ??ng m?ch c?a qu v? qu m?nh. N?u tnh tr?ng ny khng ???c ki?m sot, n c th? khi?n qu v? c nguy c? v? cc bi?n ch?ng nghim tr?ng.  Huy?t p m?c tiu c nhn c?a qu v? c th? khc nhau ty thu?c v tnh tr?ng b?nh l, tu?i v cc nhn t? khc. ??i v?i h?u h?t m?i ng??i, huy?t p bnh th??ng l d??i 120/80.  ?i?u tr? t?ng huy?t p b?ng cch thay ??i l?i s?ng, dng thu?c, ho?c k?t h?p c? hai. Thay ??i l?i s?ng bao g?m gi?m cn, ?n ch? ?? ?n c l?i cho s?c kh?e, t natri, t?p th? d?c nhi?u h?n v h?n ch? u?ng r??u. Thng tin ny khng nh?m m?c ?ch thay th? cho l?i khuyn m chuyn gia ch?m [place] s?c kh?e ni v?i qu v?. Hy b?o ??m qu v? ph?i th?o lu?n b?t k? v?n ?? g m qu v? c v?i chuyn gia ch?m [place] s?c kh?e c?a qu v?. Document Released: 01/30/2005 Document Revised: 11/09/2017 Document Reviewed: 11/09/2017 Elsevier Patient Education  2020 [employer]vier Inc.      Edwina Barth, MD [facility]

## 2019-01-02 NOTE — Patient Instructions (Addendum)
   If you have lab work done today you will be contacted with your lab results within the next 2 weeks.  If you have not heard from us then please contact us. The fastest way to get your results is to register for My Chart.   IF you received an x-ray today, you will receive an invoice from [facility]. Please contact [facility] at [phone] with questions or concerns regarding your invoice.   IF you received labwork today, you will receive an invoice from [facility]. Please contact [facility] at [phone] with questions or concerns regarding your invoice.   Our billing staff will not be able to assist you with questions regarding bills from these companies.  You will be contacted with the lab results as soon as they are available. The fastest way to get your results is to activate your My Chart account. Instructions are located on the last page of this paperwork. If you have not heard from us regarding the results in 2 weeks, please contact this office.     T?ng huy?t p, Ng??i l?n Hypertension, Adult Huy?t p cao (t?ng huy?t p) l khi l?c b?m mu qua ??ng m?ch qu m?nh. ??ng m?ch l cc m?ch mu mang mu t? tim ?i kh?p c? th?. T?ng huy?t p khi?n tim lm vi?c v?t v? h?n ?? b?m mu v c th? khi?n cc ??ng m?ch tr? ln h?p ho?c c?ng. T?ng huy?t p khng ???c ?i?u tr? ho?c ki?m sot c th? d?n t?i nh?i mu c? tim, suy tim, ??t qu?, b?nh th?n v nh?ng v?n ?? khc. Ch? s? ?o huy?t p g?m m?t ch? s? cao trn m?t ch? s? th?p. L t??ng l huy?t a?p cu?a quy? vi? c?n ph?i d??i 120/80. Ch? s? ??u tin ("??nh") ???c g?i l huy?t p tm thu. ?y l s? ?o p su?t trong ??ng m?ch khi tim qu v? ??p. Ch? s? th? hai ("?y") ???c g?i l huy?t p tm tr??ng. ?y l s? ?o p su?t trong ??ng m?ch khi tim qu v? ngh?. Nguyn nhn g gy ra? Khng r nguyn nhn chnh xc gy ra tnh tr?ng ny. C m?t s? tnh tr?ng d?n ??n ho?c c lin quan ??n huy?t p cao. ?i?u g lm t?ng nguy c?? M?t  s? y?u t? nguy c? d?n ??n huy?t p cao c th? ki?m sot ???c. Nh?ng y?u t? sau c th? lm qu v? d? b? tnh tr?ng ny h?n:  Ht thu?c.  B? b?nh ti?u ???ng tup 2, cholesterol cao, ho?c c? hai.  Khng t?p th? d?c ho?c cc ho?t ??ng th? ch?t ??y ??.  Th?a cn.  ?n qu nhi?u ch?t bo, ???ng, ca-lo, ho?c mu?i (Natri).  U?ng qu nhi?u r??u. M?t s? y?u t? nguy c? b? huy?t p cao c th? kh ho?c khng th? thay ??i. M?t s? y?u t? trong s? ny bao g?m:  B?nh th?n m?n tnh.  C ti?n s? gia ?nh b? cao huy?t p.  ?? tu?i. Nguy c? t?ng ln theo ?? tu?i.  Ch?ng t?c. Qu v? c nguy c? cao h?n n?u qu v? l ng??i M? g?c Phi.  Gi?i tnh. Nam gi?i c nguy c? cao h?n ph? n? tr??c tu?i 46. Sau tu?i 66, ph? n? c nguy c? cao h?n nam gi?i.  Ng?ng th? khi ng? do t?c ngh?n.  C?ng th?ng. Cc d?u hi?u ho?c tri?u ch?ng g? Huy?t p cao c th? khng gy ra cc tri?u ch?ng. Huy?t p r?t cao (c?n t?ng   huy?t p) c th? gy ra:  ?au ??u.  Lo u.  Kh th?.  Ch?y mu cam.  Bu?n nn v nn m?a.  Th? l?c thay ??i.  ?au ng?c d? d?i.  Co gi?t. Ch?n ?on tnh tr?ng ny nh? th? no? Tnh tr?ng ny ???c ch?n ?on b?ng cch ?o huy?t p c?a qu v? lc qu v? ng?i, ?? tay trn m?t b? m?t ph?ng, hai chn khng b?t cho, v hai bn chn b?ng ph?ng trn sn. B?ng qu?n thi?t b? ?o huy?t p s? ???c qu?n tr?c ti?p vo vng da cnh tay pha trn c?a qu v? ngang v?i m?c tim. Huy?t p c?n ???c ?o t nh?t hai l?n trn cng m?t cnh tay. M?t s? tnh tr?ng nh?t ??nh c th? lm cho huy?t p khc nhau gi?a tay ph?i v tay tri c?a qu v?. M?t s? y?u t? nh?t ??nh c th? khi?n ch? s? ?o huy?t p th?p h?n ho?c cao h?n so v?i bnh th??ng trong th?i gian ng?n:  Khi huy?t p c?a qu v? ? phng khm c?a chuyn gia ch?m sc s?c kh?e cao h?n so v?i lc qu v? ? nh, hi?n t??ng ny ???c g?i l t?ng huy?t p o chong tr?ng. H?u h?t nh?ng ng??i b? tnh tr?ng ny ??u khng c?n dng thu?c.  Khi huy?t p c?a qu v? lc ? nh cao h?n  so v?i lc qu v? ? phng khm chuyn gia ch?m sc s?c kh?e, hi?n t??ng ny ???c g?i l t?ng huy?t p ?n. H?u h?t nh?ng ng??i b? tnh tr?ng ny ??u c th? c?n dng thu?c ?? ki?m sot huy?t p. N?u qu v? c ch? s? huy?t p cao trong m?t l?n khm ho?c qu v? c huy?t p bnh th??ng c km cc y?u t? nguy c? khc, qu v? c th? ???c yu c?u:  Tr? l?i vo m?t ngy khc ?? ki?m tra l?i huy?t p.  Theo di huy?t p t?i nh trong vng 1 tu?n ho?c lu h?n. N?u qu v? ???c ch?n ?on b? t?ng huy?t p, qu v? c th? c?n th?c hi?n cc xt nghi?m mu ho?c ki?m tra hnh ?nh khc ?? gip chuyn gia ch?m sc s?c kh?e hi?u nguy c? t?ng th? m?c cc b?nh tr?ng khc. Tnh tr?ng ny ???c ?i?u tr? nh? th? no? Tnh tr?ng ny ???c ?i?u tr? b?ng cch thay ??i l?i s?ng lnh m?nh, ch?ng h?nh nh? ?n th?c ph?m c l?i cho s?c kh?e, t?p th? d?c nhi?u h?n v gi?m l??ng r??u u?ng vo. N?u thay ??i l?i s?ng khng ?? ?? ??a huy?t p v? m?c c th? ki?m sot ???c, chuyn gia ch?m sc s?c kh?e c th? k ??n thu?c, v n?u:  Huy?t p tm thu c?a qu v? trn 130.  Huy?t p tm tr??ng c?a qu v? trn 80. Huy?t p m?c tiu c nhn c?a qu v? c th? khc nhau ty thu?c v tnh tr?ng b?nh l, tu?i v cc nhn t? khc. Tun th? nh?ng h??ng d?n ny ? nh: ?n v u?ng   ?n ch? ?? giu ch?t x? v kali v t natri, ???ng ph? gia v ch?t bo. M?t k? ho?ch ?n m?u c tn l ch? ?? ?n DASH (Cch ti?p c?n ch? ?? ?n u?ng ?? ng?n ch?n t?ng huy?t p). ?n theo cch ny: ? ?n nhi?u tri cy v rau t??i. Vo m?i b?a ?n, c? g?ng dnh m?t n?a ??a cho tri cy v rau c?. ? ?n ng? c?c nguyn cm,   ch?ng h?n nh? m ?ng lm t? b?t m nguyn cm, g?o l?t, ho?c bnh m t? b?t m nguyn cm. Cho ngu? c?c nguyn ca?m va?o kho?ng m?t ph?n t? ??a. ? ?n ho?c hu?ng cc s?n ph?m t? s?a t bo, ch?ng h?n nh? s?a ? b? kem ho?c s?a chua t bo. ? Trnh nh?ng mi?ng th?t nhi?u m?, th?t ? qua ch? bi?n ho?c th?t ??p mu?i v th?t gia c?m c da. Dnh kho?ng m?t ph?n t? ??a  c?a qu v? cho cc protein khng m?, ch?ng h?n nh? c, th?t g khng da, ??u, tr?ng, ho?c ??u ph?. ? Trnh nh?ng th?c ph?m ch? bi?n ho?c lm s?n. Nh?ng th?c ph?m ny th??ng c nhi?u natri, ???ng ph? gia v ch?t bo h?n.  Gi?m l??ng dng natri hng ngy c?a qu v?. H?u h?t nh?ng ng??i b? t?ng huy?t p ??u nn ?n d??i 1.500 mg natri m?i ngy.  Khng u?ng r??u n?u: ? Chuyn gia ch?m sc s?c kh?e c?a qu v? khuyn qu v? khng u?ng r??u. ? Qu v? c thai, c th? c thai, ho?c c k? ho?ch c thai.  N?u qu v? u?ng r??u: ? Gi?i h?n l??ng r??u qu v? u?ng ? m?c:  0-1 ly/ngy ??i v?i n? gi?i.  0-2 ly/ngy ??i v?i nam gi?i. ? Bi?t r m?t ly c bao nhiu r??u. ? M?, m?t ly t??ng ???ng v?i m?t chai bia 12 ao x? (355 mL), m?t ly r??u vang 5 ao x? (148 mL), ho?c m?t ly r??u m?nh 1 ao x? (44 mL). L?i s?ng   H?p tc v?i chuyn gia ch?m sc s?c kh?e c?a qu v? ?? duy tr tr?ng l??ng c? th? c l?i cho s?c kh?e ho?c ?? gi?m cn. Hy h?i xem tr?ng l??ng no l l t??ng cho qu v?.  T?p th? d?c t nh?t 30 pht h?u h?t cc ngy trong tu?n. Cc ho?t ??ng c th? bao g?m ?i b?, b?i, ho?c ??p xe.  Bao g?m bi t?p t?ng c??ng c? (bi t?p khng l?c), ch?ng h?n nh? bi t?p [place] ho?c nng t?, nh? m?t ph?n c?a thi quen luy?n t?p hng tu?n c?a qu v?. C? g?ng t?p nh?ng lo?i bi t?p ny trong vng 30 pht t?i thi?u 3 ngy m?i tu?n.  Khng s? d?ng b?t k? s?n ph?m no c nicotine ho?c thu?c l, ch?ng ha?n nh? thu?c l d?ng ht, thu?c l ?i?n t? v thu?c l d?ng nhai. N?u qu v? c?n gip ?? ?? cai thu?c, hy h?i chuyn gia ch?m sc s?c kh?e.  Theo di huy?t p c?a qu v? t?i nh theo h??ng d?n c?a chuyn gia ch?m sc s?c kh?e.  Tun th? t?t c? cc l?n khm theo di theo ch? d?n c?a chuyn gia ch?m sc s?c kh?e. ?i?u ny c vai tr quan tr?ng. Thu?c  Ch? s? d?ng thu?c khng k ??n v thu?c k ??n theo ch? d?n c?a chuyn gia ch?m sc s?c kh?e. Lm theo ch? d?n m?t cch c?n th?n. Thu?c ?i?u tr? huy?t p ph?i ???c dng  theo ??n ? k.  Khng b? cc li?u thu?c huy?t p. Vi?c b? dng thu?c khi?n qu v? c nguy c? g?p ph?i cc v?n ?? v c th? lm cho thu?c gi?m hi?u qu?.  Hy h?i chuyn gia ch?m sc s?c kh?e c?a qu v? v? nh?ng tc d?ng ph? ho?c ph?n ?ng v?i thu?c m qu v? ph?i theo di. Hy lin l?c v?i chuyn gia ch?m sc s?c kh?e n?u qu v?:    Ngh? qu v? c ph?n ?ng v?i thu?c ?ang dng.  B? ?au ??u ti?p t?c tr? l?i (ti pht).  C?m th?y chng m?t.  B? s?ng ph ? c? chn.  C v?n ?? v? th? l?c. Yu c?u tr? gip ngay l?p t?c n?u qu v?:  B? ?au ??u r?t nhi?u ho?c l l?n.  B? y?u ho?c t b b?t th??ng.  C?m th?y ng?t x?u.  B? ?au r?t nhi?u ? ng?c ho?c b?ng.  Nn nhi?u l?n.  B? kh th?. Tm t?t  T?ng huy?t p l khi l?c b?m mu qua cc ??ng m?ch c?a qu v? qu m?nh. N?u tnh tr?ng ny khng ???c ki?m sot, n c th? khi?n qu v? c nguy c? v? cc bi?n ch?ng nghim tr?ng.  Huy?t p m?c tiu c nhn c?a qu v? c th? khc nhau ty thu?c v tnh tr?ng b?nh l, tu?i v cc nhn t? khc. ??i v?i h?u h?t m?i ng??i, huy?t p bnh th??ng l d??i 120/80.  ?i?u tr? t?ng huy?t p b?ng cch thay ??i l?i s?ng, dng thu?c, ho?c k?t h?p c? hai. Thay ??i l?i s?ng bao g?m gi?m cn, ?n ch? ?? ?n c l?i cho s?c kh?e, t natri, t?p th? d?c nhi?u h?n v h?n ch? u?ng r??u. Thng tin ny khng nh?m m?c ?ch thay th? cho l?i khuyn m chuyn gia ch?m sc s?c kh?e ni v?i qu v?. Hy b?o ??m qu v? ph?i th?o lu?n b?t k? v?n ?? g m qu v? c v?i chuyn gia ch?m sc s?c kh?e c?a qu v?. Document Released: 01/30/2005 Document Revised: 11/09/2017 Document Reviewed: 11/09/2017 [employer] Patient Education  2020 [employer]  

## 2019-01-03 LAB — COMPREHENSIVE METABOLIC PANEL
ALT: 25 IU/L (ref 0–44)
AST: 23 IU/L (ref 0–40)
Albumin/Globulin Ratio: 1.2 (ref 1.2–2.2)
Albumin: 4.1 g/dL (ref 3.7–4.7)
Alkaline Phosphatase: 94 IU/L (ref 39–117)
BUN/Creatinine Ratio: 15 (ref 10–24)
BUN: 15 mg/dL (ref 8–27)
Bilirubin Total: 0.3 mg/dL (ref 0.0–1.2)
CO2: 23 mmol/L (ref 20–29)
Calcium: 9.4 mg/dL (ref 8.6–10.2)
Chloride: 100 mmol/L (ref 96–106)
Creatinine, Ser: 0.98 mg/dL (ref 0.76–1.27)
GFR calc Af Amer: 86 mL/min/{1.73_m2} (ref >59)
GFR calc non Af Amer: 74 mL/min/{1.73_m2} (ref >59)
Globulin, Total: 3.3 g/dL (ref 1.5–4.5)
Glucose: 197 mg/dL — ABNORMAL HIGH (ref 65–99)
Potassium: 4.2 mmol/L (ref 3.5–5.2)
Sodium: 138 mmol/L (ref 134–144)
Total Protein: 7.4 g/dL (ref 6.0–8.5)

## 2019-01-03 LAB — LIPID PANEL
Chol/HDL Ratio: 7.8 ratio — ABNORMAL HIGH (ref 0.0–5.0)
Cholesterol, Total: 274 mg/dL — ABNORMAL HIGH (ref 100–199)
HDL: 35 mg/dL — ABNORMAL LOW (ref >39)
LDL Chol Calc (NIH): 114 mg/dL — ABNORMAL HIGH (ref 0–99)
Triglycerides: 701 mg/dL (ref 0–149)
VLDL Cholesterol Cal: 125 mg/dL — ABNORMAL HIGH (ref 5–40)

## 2019-01-03 LAB — HEMOGLOBIN A1C
Est. average glucose Bld gHb Est-mCnc: 126 mg/dL
Hgb A1c MFr Bld: 6 % — ABNORMAL HIGH (ref 4.8–5.6)

## 2019-02-26 ENCOUNTER — Telehealth: Payer: Self-pay | Admitting: *Deleted

## 2019-02-26 NOTE — Telephone Encounter (Signed)
Schedule AWV.  

## 2019-07-03 ENCOUNTER — Other Ambulatory Visit: Payer: Self-pay

## 2019-07-03 ENCOUNTER — Ambulatory Visit (INDEPENDENT_AMBULATORY_CARE_PROVIDER_SITE_OTHER): Payer: Medicare HMO | Admitting: Emergency Medicine

## 2019-07-03 ENCOUNTER — Encounter: Payer: Self-pay | Admitting: Emergency Medicine

## 2019-07-03 VITALS — BP 134/72 | HR 89 | Temp 98.1°F | Resp 16 | Ht 62.0 in | Wt 132.0 lb

## 2019-07-03 DIAGNOSIS — I10 Essential (primary) hypertension: Secondary | ICD-10-CM | POA: Diagnosis not present

## 2019-07-03 DIAGNOSIS — R7303 Prediabetes: Secondary | ICD-10-CM

## 2019-07-03 DIAGNOSIS — E785 Hyperlipidemia, unspecified: Secondary | ICD-10-CM

## 2019-07-03 MED ORDER — ROSUVASTATIN CALCIUM 20 MG PO TABS: 20.0000 mg | ORAL_TABLET | Freq: Every day | ORAL | 3 refills | Status: DC

## 2019-07-03 MED ORDER — ROSUVASTATIN CALCIUM 10 MG PO TABS: 10.0000 mg | ORAL_TABLET | Freq: Every day | ORAL | 3 refills | Status: DC

## 2019-07-03 MED ORDER — LISINOPRIL 20 MG PO TABS: 20.0000 mg | ORAL_TABLET | Freq: Every day | ORAL | 3 refills | Status: DC

## 2019-07-03 NOTE — Progress Notes (Signed)
Kenneth Moore 79 y.o.   Chief Complaint  Patient presents with  . Hypertension    follow up   . Medication Refill    Lisinopril and Rosuvastatin    HISTORY OF PRESENT ILLNESS: This is a 79 y.o. male with history of hypertension, dyslipidemia, and prediabetes here for follow-up.  Daughter helping with [place] translation. 1.  Hypertension: On lisinopril 20 mg daily. BP Readings from Last 3 Encounters:  07/03/19 134/72  01/02/19 (!) 152/85  06/24/18 116/75   2.  Dyslipidemia: On rosuvastatin 10 mg daily.  Had lunch at noontime today.  We will do lipid profile. Lab Results  Component Value Date   CHOL 274 (H) 01/02/2019   HDL 35 (L) 01/02/2019   LDLCALC 114 (H) 01/02/2019   TRIG 701 (HH) 01/02/2019   CHOLHDL 7.8 (H) 01/02/2019   3.  Prediabetes Lab Results  Component Value Date   HGBA1C 6.0 (H) 01/02/2019  Fully vaccinated against Covid. Has no complaints or medical concerns today.   HPI   Prior to Admission medications   Medication Sig Start Date End Date Taking? Authorizing Provider  lisinopril (ZESTRIL) 20 MG tablet Take 1 tablet (20 mg total) by mouth daily. 01/02/19  Yes Kenneth Moore, Kenneth Bloomer, MD  rosuvastatin (CRESTOR) 10 MG tablet Take 1 tablet (10 mg total) by mouth daily. 01/02/19  Yes Kenneth Bloomer, MD    No Known Allergies  Patient Active Problem List   Diagnosis Date Noted  . Dyslipidemia 01/02/2019  . Prediabetes 01/02/2019  . Essential hypertension 06/24/2018  . Arthritis, shoulder region 11/07/2016    Past Medical History:  Diagnosis Date  . Hypertension     History reviewed. No pertinent surgical history.  Social History   Socioeconomic History  . Marital status: Married    Spouse name: Not on file  . Number of children: 3  . Years of education: Not on file  . Highest education level: Not on file  Occupational History  . Not on file  Tobacco Use  . Smoking status: Never Smoker  . Smokeless tobacco: Never Used  Substance  and Sexual Activity  . Alcohol use: No  . Drug use: Not Currently  . Sexual activity: Not Currently  Other Topics Concern  . Not on file  Social History Narrative  . Not on file   Social Determinants of Health   Financial Resource Strain:   . Difficulty of Paying Living Expenses:   Food Insecurity:   . Worried About Charity fundraiser in the Last Year:   . Arboriculturist in the Last Year:   Transportation Needs:   . Film/video editor (Medical):   Marland Kitchen Lack of Transportation (Non-Medical):   Physical Activity:   . Days of Exercise per Week:   . Minutes of Exercise per Session:   Stress:   . Feeling of Stress :   Social Connections:   . Frequency of Communication with Friends and Family:   . Frequency of Social Gatherings with Friends and Family:   . Attends Religious Services:   . Active Member of Clubs or Organizations:   . Attends Archivist Meetings:   Marland Kitchen Marital Status:   Intimate Partner Violence:   . Fear of Current or Ex-Partner:   . Emotionally Abused:   Marland Kitchen Physically Abused:   . Sexually Abused:     History reviewed. No pertinent family history.   Review of Systems  Constitutional: Negative.  Negative for chills and fever.  HENT:  Negative.  Negative for congestion and sore throat.   Respiratory: Negative.  Negative for cough and shortness of breath.   Cardiovascular: Negative.  Negative for chest pain and palpitations.  Gastrointestinal: Negative.  Negative for abdominal pain, blood in stool, diarrhea, melena, nausea and vomiting.  Genitourinary: Negative.  Negative for dysuria and hematuria.  Musculoskeletal: Negative.  Negative for back pain, myalgias and neck pain.  Skin: Negative.  Negative for rash.  Neurological: Negative.  Negative for dizziness.  All other systems reviewed and are negative.  Vitals:   07/03/19 1618  BP: 134/72  Pulse: 89  Resp: 16  Temp: 98.1 F (36.7 C)  SpO2: 96%     Physical Exam Vitals reviewed.    Constitutional:      Appearance: Normal appearance.  HENT:     Head: Normocephalic.  Eyes:     Extraocular Movements: Extraocular movements intact.     Conjunctiva/sclera: Conjunctivae normal.     Pupils: Pupils are equal, round, and reactive to light.  Cardiovascular:     Rate and Rhythm: Normal rate and regular rhythm.     Pulses: Normal pulses.     Heart sounds: Normal heart sounds.  Pulmonary:     Effort: Pulmonary effort is normal.     Breath sounds: Normal breath sounds.  Abdominal:     General: There is no distension.     Palpations: Abdomen is soft.     Tenderness: There is no abdominal tenderness.  Musculoskeletal:        General: Normal range of motion.     Cervical back: Normal range of motion and neck supple.  Skin:    General: Skin is warm and dry.     Capillary Refill: Capillary refill takes less than 2 seconds.  Neurological:     General: No focal deficit present.     Mental Status: He is alert and oriented to person, place, and time.  Psychiatric:        Mood and Affect: Mood normal.        Behavior: Behavior normal.    The 10-year ASCVD risk score Denman George DC Jr., et al., 2013) is: 41.2%   Values used to calculate the score:     Age: 89 years     Sex: Male     Is Non-Hispanic African American: No     Diabetic: No     Tobacco smoker: No     Systolic Blood Pressure: 134 mmHg     Is BP treated: Yes     HDL Cholesterol: 35 mg/dL     Total Cholesterol: 274 mg/dL  A total of 30 minutes was spent with the patient and daughter, greater than 50% of which was in counseling/coordination of care regarding chronic medical problems, cardiovascular risks associated with these conditions, review of all medications, review of most recent office visit notes, review of most recent blood work results, diet and nutrition, prognosis and need for follow-up.   ASSESSMENT & PLAN: Essential hypertension Well-controlled blood pressure.  Continue present medication, lisinopril  20 mg daily.  Dyslipidemia Lipid profile done today, 4 hours post prandial.  Will increase rosuvastatin to 20 mg daily.  Kenneth Moore was seen today for hypertension and medication refill.  Diagnoses and all orders for this visit:  Essential hypertension -     Comprehensive metabolic panel -     lisinopril (ZESTRIL) 20 MG tablet; Take 1 tablet (20 mg total) by mouth daily.  Dyslipidemia -     Comprehensive metabolic panel -  Lipid panel -     Discontinue: rosuvastatin (CRESTOR) 10 MG tablet; Take 1 tablet (10 mg total) by mouth daily. -     rosuvastatin (CRESTOR) 20 MG tablet; Take 1 tablet (20 mg total) by mouth daily.  Prediabetes -     Hemoglobin A1c    Patient Instructions       If you have lab work done today you will be contacted with your lab results within the next 2 weeks.  If you have not heard from [place] then please contact us. The fastest way to get your results is to register for My Chart.   IF you received an x-ray today, you will receive an invoice from [facility]. Please contact [facility] at [phone] with questions or concerns regarding your invoice.   IF you received labwork today, you will receive an invoice from [place]. Please contact [facility] at [phone] with questions or concerns regarding your invoice.   Our billing staff will not be able to assist you with questions regarding bills from these companies.  You will be contacted with the lab results as soon as they are available. The fastest way to get your results is to activate your My Chart account. Instructions are located on the last page of this paperwork. If you have not heard from [place] regarding the results in 2 weeks, please contact this office.     R?i loa?n m?? ma?u Dyslipidemia R?i lo?n m? mu l tnh tr?ng m?t cn b?ng cc ch?t gi?ng nh? sp, m? (ch?t bo) trong mu. C? th? chng ta c?n c l??ng nh? ch?t bo. R?i lo?n m? mu th??ng ko theo l??ng cc lo?i ch?t bo cao  l cholesterol ho?c triglycerides. Cc d?ng r?i loa?n m?? ma?u th??ng g?p bao g?m:  L??ng LDL cholesterol cao. LDL l lo?i cholesterol gy ra cc c?n ch?t bo (m?ng bm) tch t? trong m?ch mu mang mu ra kh?i tim qu v? (??ng m?ch).  N?ng ?? HDL cholesterol th?p. HDL cholesterol l lo?i cholesterol b?o v? ch?ng l?i b?nh tim. L??ng HDL cao s? lo?i b? m?ng bm LDL ra kh?i ??ng m?ch.  L??ng triglycerides cao. Triglycerides la? ch?t be?o trong ma?u lin quan ??n s? ti?ch tu? ma?ng ba?m trong ??ng ma?ch. Nguyn nhn g gy ra? R?i lo?n m? mu nguyn pht l do cc thay ??i (??t bi?n) trong gen ???c truy?n qua cc th? h? trong gia ?nh (di truy?n). Nh?ng ??t bi?n ny gy ra m?t s? lo?i r?i lo?n m? mu. R?i lo?n m? mu th? pht l do m?t s? l?a ch?n v? l?i s?ng v b?nh l d?n ??n r?i lo?n m? mu, nh? l:  ?n ch? ?? ?n nhi?u m? ??ng v?t.  Khng t?p th? du?c ?u?.  B? b?nh ti?u ???ng, b?nh th?n, b?nh gan ho?c b?nh tuy?n gip.  U?ng nhi?u r??u.  Dng m?t s? lo?i thu?c nh?t ??nh. ?i?u g lm t?ng nguy c?? Qu v? d? b? tnh tr?ng ny h?n n?u qu v? l nam gi?i cao tu?i ho?c n?u qu v? l n? gi?i ? mn kinh. Cc y?u t? nguy c? khc bao g?m:  C ti?n s? gia ?nh b? r?i lo?n m? mu.  ?ang dng m?t s? loa?i thu?c nh?t ??nh, bao g?m vin thu?c tra?nh thai, steroid, m?t s? thu?c l?i ti?u v thu?c che?n beta.  Ht thu?c l.  ?n ch? ?? ?n nhi?u m??.  C m?t s? ti?nh tra?ng b?nh ly? nh?t ??nh nh? ti?u ????ng, h?i ch??ng bu?ng tr??ng ?a  nang (PCOS), b?nh th?n, b?nh gan ho?c suy gia?p.  Khng t?p th? d?c th??ng xuyn.  B? th?a cn ho?c bo ph c m? b?ng qu nhi?u. Cc d?u hi?u ho?c tri?u ch?ng l g? Trong h?u h?t cc tr??ng h?p, r?i lo?n m? mu th??ng khng gy ra b?t k? tri?u ch?ng no. Trong cc tr??ng h?p n?ng, l??ng lipid r?t cao c th? gy ra:  Cc c?c m? d??i da (u vng).  Vng trn mu tr?ng ho?c xm xung quanh tm mu ?en (??ng t?) c?a m?t. L??ng triglyceride r?t  cao c th? gy vim tuy?n t?y (vim t?y). Ch?n ?on tnh tr?ng ny nh? th? no? Chuyn gia ch?m [place] s?c kh?e c th? ch?n ?on r?i lo?n m? mu d?a trn m?t xt nghi?m mu thng th??ng (xt nghi?m mu lc ?i). Do h?u h?t m?i ng??i khng c tri?u ch?ng c?a tnh tr?ng ny, xt nghi?m mu ny (h? s? ch?t bo) ???c th?c hi?n ? ng??i tr??ng thnh t? 20 tu?i tr? ln v ???c l?p l?i 5 n?m m?t l?n. Xt nghi?m ny ki?m tra:  Cholesterol ton ph?n. Xt nghi?m ny ?o t?ng l??ng cholesterol trong mu c?a qu v?, bao g?m LDL cholesterol, HDL cholesterol v triglycerides. Con s? c?a ng??i kh?e m?nh l d??i 200.  LDL cholesterol. Con s? mu?c tiu cu?a LDL cholesterol ?? m?i ng???i khc nhau, tu?y thu?c va?o ca?c y?u t? nguy c? c nhn. Hy h?i chuyn gia ch?m [place] s?c kh?e c?a quy? vi? xem n?ng ?? LDL cholesterol c?a quy? vi? nn la? bao nhiu.  HDL cholesterol. L??ng HDL t? 60 tr? ln l t?t nh?t v n gip b?o v? ch?ng l?i b?nh tim. Con s? d??i 40 ??i v?i nam gi?i ho?c d??i 50 ??i v?i n? gi?i lm t?ng nguy c? b? b?nh tim.  Triglycerides. Con s? triglycecide ? ng??i kh?e m?nh l d???i 150. N?u h? s? ch?t bo c?a qu v? b?t th??ng, chuyn gia ch?m [place] s?c kh?e c th? th?c hi?n cc xt nghi?m mu khc. Tnh tr?ng ny ???c ?i?u tr? nh? th? no? ?i?u tr? ph? thu?c vo lo?i r?i lo?n ch?t bo m qu v? b? v cc y?u t? nguy c? khc ??i v?i b?nh tim v ??t qu?Marland Kitchen Chuyn gia ch?m [place] s?c kh?e s? c m?t kho?ng m?c tiu cho l??ng ch?t bo c?a qu v? d?a trn thng tin ny. ??i v?i nhi?u ng??i, tnh tr?ng ny c th? ???c ?i?u tr? b?ng cch thay ??i l?i s?ng, nh? l ch? ?? ?n v t?p th? d?c. Chuyn gia ch?m [place] s?c kh?e c th? khuy?n ngh? r?ng qu v? nn:  T?p th? d?c th??ng xuyn.  Thay ??i ch? ?? ?n.  B? thu?c l, n?u qu v? ht thu?c. N?u thay ??i ch? ?? ?n v t?p th? d?c khng gip qu v? ??t ???c m?c tiu c?a mnh, chuyn gia ch?m [place] s?c kh?e c?ng c th? k ??n thu?c ?? lm gi?m ch?t bo. Lo?i thu?c ???c k ??n  ph? bi?n nh?t lm gi?m LDL cholesterol (thu?c statin). N?u qu v? c l??ng triglyceride cao, chuyn gia c th? k ??n m?t lo?i thu?c khc (fibrate) ho?c th?c ph?m b? sung d?u c omega-3 ho?c c? hai. Tun th? nh?ng h??ng d?n ny ? nh:  ?n v u?ng  Tun th? ch? d?n c?a chuyn gia ch?m  s?c kh?e ho?c bc s? chuyn khoa [place] d??ng v? cc h?n ch? ?n ho?c u?ng.  ?n ch? ?? ?n lnh m?nh theo ch? d?n c?a  chuyn gia ch?m [place] s?c kh?e c?a qu v?. ?i?u ny c th? gip qu v? ??t v duy tr cn n?ng c l?i cho s?c kh?e, lm gi?m LDL cholesterol v t?ng HDL cholesterol. Vi?c ny c th? bao g?m: ? Gi?i h?n l??ng calo, n?u qu v? b? th?a cn. ? ?n nhi?u tri cy, rau, ng? c?c nguyn cm, c v th?t n?c. ? Gi?i h?n ch?t bo bo ha, ch?t bo trans v cholesterol.  N?u qu v? u?ng r??u: ? Gi?i h?n l??ng r??u qu v? s? d?ng. ? Bi?t r m?t ly c bao nhiu r??u. ? M?, m?t ly t??ng ???ng v?i m?t chai bia 12 ao x? (355 mL), m?t ly r??u vang 5 ao x? (148 mL), ho?c m?t ly r??u m?nh 1 ao x? (44 mL).  Khng u?ng r??u n?u: ? Chuyn gia ch?m [place] s?c kh?e c?a qu v? khuyn qu v? khng u?ng r??u. ? Qu v? c [place], c th? c [place], ho?c c k? ho?ch c [place]. Ho?t ??ng  T?p th? d?c th??ng xuyn. B??t ??u m?t ch??ng tri?nh t?p th? d?c v luy?n t?p t?ng s?c m?nh c? b?p theo chi? d?n cu?a chuyn gia ch?m so?c s??c kho?e. Hy h?i chuyn gia ch?m [place] s?c kh?e v? cc ho?t ??ng no l an ton cho qu v?. Chuyn gia ch?m [place] s?c kh?e c?a qu v? c th? khuy?n ngh?: ? Ho?t ??ng th? d?c th?m m? 30 pht m?i ngy, 4-6 ngy m?i tu?n. ?i b? nhanh l m?t v d? v? ho?t ??ng th? d?c th?m m?. ? Luy?n t?p t?ng s?c m?nh c? b?p 2 ngy m?i tu?n. H??ng d?n chung  Khng s? d?ng b?t k? s?n ph?m no c nicotine ho?c thu?c l, ch?ng ha?n nh? thu?c l d?ng ht, thu?c l ?i?n t? v thu?c l d?ng nhai. N?u qu v? c?n gip ?? ?? cai thu?c, hy h?i chuyn gia ch?m [place] s?c kh?e.  Ch? s? d?ng thu?c khng k ??n v thu?c k ??n theo ch? d?n c?a  chuyn gia ch?m [place] s?c kh?e. ?i?u ny bao g?m cc th?c ph?m b? sung.  Tun th? t?t c? cc l?n khm theo di theo ch? d?n c?a chuyn gia ch?m [place] s?c kh?e. Hy lin l?c v?i chuyn gia ch?m [place] s?c kh?e n?u:  Qu v?: ? G?p v?n ?? v?i vi?c tun th? k? ho?ch t?p th? d?c ho?c ?n [place] c?a mnh. ? ??u tranh ?? b? ht thu?c ho?c ki?m sot vi?c s? d?ng r??u c?a mnh. Tm t?t  R?i lo?n m? mu th??ng ko theo l??ng cc lo?i ch?t bo cao l cholesterol ho?c triglycerides.  ?i?u tr? ph? thu?c vo lo?i r?i lo?n ch?t bo m qu v? b? v cc y?u t? nguy c? khc ??i v?i b?nh tim v ??t qu?.  ??i v?i nhi?u ng??i, ?i?u tr? b?t ??u b?ng vi?c thay ??i l?i s?ng, nh? l ch? ?? ?n v t?p th? d?c.  Chuyn gia ch?m [place] s?c kh?e c th? k ??n thu?c ?? lm gi?m ch?t bo. Thng tin ny khng nh?m m?c ?ch thay th? cho l?i khuyn m chuyn gia ch?m [place] s?c kh?e ni v?i qu v?. Hy b?o ??m qu v? ph?i th?o lu?n b?t k? v?n ?? g m qu v? c v?i chuyn gia ch?m [place] s?c kh?e c?a qu v?. Document Revised: 10/02/2017 Document Reviewed: 10/02/2017 Elsevier Patient Education  2020 Elsevier Inc.       Edwina BarthMiguel Atia Haupt, MD [facility]

## 2019-07-03 NOTE — Patient Instructions (Addendum)
If you have lab work done today you will be contacted with your lab results within the next 2 weeks.  If you have not heard from [place] then please contact us. The fastest way to get your results is to register for My Chart.   IF you received an x-ray today, you will receive an invoice from [facility]. Please contact [facility] at [phone] with questions or concerns regarding your invoice.   IF you received labwork today, you will receive an invoice from [place]. Please contact [facility] at [phone] with questions or concerns regarding your invoice.   Our billing staff will not be able to assist you with questions regarding bills from these companies.  You will be contacted with the lab results as soon as they are available. The fastest way to get your results is to activate your My Chart account. Instructions are located on the last page of this paperwork. If you have not heard from [place] regarding the results in 2 weeks, please contact this office.     R?i loa?n m?? ma?u Dyslipidemia R?i lo?n m? mu l tnh tr?ng m?t cn b?ng cc ch?t gi?ng nh? sp, m? (ch?t bo) trong mu. C? th? chng ta c?n c l??ng nh? ch?t bo. R?i lo?n m? mu th??ng ko theo l??ng cc lo?i ch?t bo cao l cholesterol ho?c triglycerides. Cc d?ng r?i loa?n m?? ma?u th??ng g?p bao g?m:  L??ng LDL cholesterol cao. LDL l lo?i cholesterol gy ra cc c?n ch?t bo (m?ng bm) tch t? trong m?ch mu mang mu ra kh?i tim qu v? (??ng m?ch).  N?ng ?? HDL cholesterol th?p. HDL cholesterol l lo?i cholesterol b?o v? ch?ng l?i b?nh tim. L??ng HDL cao s? lo?i b? m?ng bm LDL ra kh?i ??ng m?ch.  L??ng triglycerides cao. Triglycerides la? ch?t be?o trong ma?u lin quan ??n s? ti?ch tu? ma?ng ba?m trong ??ng ma?ch. Nguyn nhn g gy ra? R?i lo?n m? mu nguyn pht l do cc thay ??i (??t bi?n) trong gen ???c truy?n qua cc th? h? trong gia ?nh (di truy?n). Nh?ng ??t bi?n ny gy ra m?t s? lo?i r?i  lo?n m? mu. R?i lo?n m? mu th? pht l do m?t s? l?a ch?n v? l?i s?ng v b?nh l d?n ??n r?i lo?n m? mu, nh? l:  ?n ch? ?? ?n nhi?u m? ??ng v?t.  Khng t?p th? du?c ?u?.  B? b?nh ti?u ???ng, b?nh th?n, b?nh gan ho?c b?nh tuy?n gip.  U?ng nhi?u r??u.  Dng m?t s? lo?i thu?c nh?t ??nh. ?i?u g lm t?ng nguy c?? Qu v? d? b? tnh tr?ng ny h?n n?u qu v? l nam gi?i cao tu?i ho?c n?u qu v? l n? gi?i ? mn kinh. Cc y?u t? nguy c? khc bao g?m:  C ti?n s? gia ?nh b? r?i lo?n m? mu.  ?ang dng m?t s? loa?i thu?c nh?t ??nh, bao g?m vin thu?c tra?nh thai, steroid, m?t s? thu?c l?i ti?u v thu?c che?n beta.  Ht thu?c l.  ?n ch? ?? ?n nhi?u m??.  C m?t s? ti?nh tra?ng b?nh ly? nh?t ??nh nh? ti?u ????ng, h?i ch??ng bu?ng tr??ng ?a nang (PCOS), b?nh th?n, b?nh gan ho?c suy gia?p.  Khng t?p th? d?c th??ng xuyn.  B? th?a cn ho?c bo ph c m? b?ng qu nhi?u. Cc d?u hi?u ho?c tri?u ch?ng l g? Trong h?u h?t cc tr??ng h?p, r?i lo?n m? mu th??ng khng gy ra b?t k? tri?u ch?ng no. Trong cc tr??ng  h?p n?ng, l??ng lipid r?t cao c th? gy ra:  Cc c?c m? d??i da (u vng).  Vng trn mu tr?ng ho?c xm xung quanh tm mu ?en (??ng t?) c?a m?t. L??ng triglyceride r?t cao c th? gy vim tuy?n t?y (vim t?y). Ch?n ?on tnh tr?ng ny nh? th? no? Chuyn gia ch?m [place] s?c kh?e c th? ch?n ?on r?i lo?n m? mu d?a trn m?t xt nghi?m mu thng th??ng (xt nghi?m mu lc ?i). Do h?u h?t m?i ng??i khng c tri?u ch?ng c?a tnh tr?ng ny, xt nghi?m mu ny (h? s? ch?t bo) ???c th?c hi?n ? ng??i tr??ng thnh t? 20 tu?i tr? ln v ???c l?p l?i 5 n?m m?t l?n. Xt nghi?m ny ki?m tra:  Cholesterol ton ph?n. Xt nghi?m ny ?o t?ng l??ng cholesterol trong mu c?a qu v?, bao g?m LDL cholesterol, HDL cholesterol v triglycerides. Con s? c?a ng??i kh?e m?nh l d??i 200.  LDL cholesterol. Con s? mu?c tiu cu?a LDL cholesterol ?? m?i ng???i khc nhau, tu?y thu?c  va?o ca?c y?u t? nguy c? c nhn. Hy h?i chuyn gia ch?m [place] s?c kh?e c?a quy? vi? xem n?ng ?? LDL cholesterol c?a quy? vi? nn la? bao nhiu.  HDL cholesterol. L??ng HDL t? 60 tr? ln l t?t nh?t v n gip b?o v? ch?ng l?i b?nh tim. Con s? d??i 40 ??i v?i nam gi?i ho?c d??i 50 ??i v?i n? gi?i lm t?ng nguy c? b? b?nh tim.  Triglycerides. Con s? triglycecide ? ng??i kh?e m?nh l d???i 150. N?u h? s? ch?t bo c?a qu v? b?t th??ng, chuyn gia ch?m [place] s?c kh?e c th? th?c hi?n cc xt nghi?m mu khc. Tnh tr?ng ny ???c ?i?u tr? nh? th? no? ?i?u tr? ph? thu?c vo lo?i r?i lo?n ch?t bo m qu v? b? v cc y?u t? nguy c? khc ??i v?i b?nh tim v ??t qu?Marland Kitchen Chuyn gia ch?m [place] s?c kh?e s? c m?t kho?ng m?c tiu cho l??ng ch?t bo c?a qu v? d?a trn thng tin ny. ??i v?i nhi?u ng??i, tnh tr?ng ny c th? ???c ?i?u tr? b?ng cch thay ??i l?i s?ng, nh? l ch? ?? ?n v t?p th? d?c. Chuyn gia ch?m [place] s?c kh?e c th? khuy?n ngh? r?ng qu v? nn:  T?p th? d?c th??ng xuyn.  Thay ??i ch? ?? ?n.  B? thu?c l, n?u qu v? ht thu?c. N?u thay ??i ch? ?? ?n v t?p th? d?c khng gip qu v? ??t ???c m?c tiu c?a mnh, chuyn gia ch?m [place] s?c kh?e c?ng c th? k ??n thu?c ?? lm gi?m ch?t bo. Lo?i thu?c ???c k ??n ph? bi?n nh?t lm gi?m LDL cholesterol (thu?c statin). N?u qu v? c l??ng triglyceride cao, chuyn gia c th? k ??n m?t lo?i thu?c khc (fibrate) ho?c th?c ph?m b? sung d?u c omega-3 ho?c c? hai. Tun th? nh?ng h??ng d?n ny ? nh:  ?n v u?ng  Tun th? ch? d?n c?a chuyn gia ch?m [place] s?c kh?e ho?c bc s? chuyn khoa [place] d??ng v? cc h?n ch? ?n ho?c u?ng.  ?n ch? ?? ?n lnh m?nh theo ch? d?n c?a chuyn gia ch?m [place] s?c kh?e c?a qu v?. ?i?u ny c th? gip qu v? ??t v duy tr cn n?ng c l?i cho s?c kh?e, lm gi?m LDL cholesterol v t?ng HDL cholesterol. Vi?c ny c th? bao g?m: ? Gi?i h?n l??ng calo, n?u qu v? b? th?a cn. ? ?n nhi?u tri cy, rau, ng?  c?c nguyn cm, c v th?t  n?c. ? Gi?i h?n ch?t bo bo ha, ch?t bo trans v cholesterol.  N?u qu v? u?ng r??u: ? Gi?i h?n l??ng r??u qu v? s? d?ng. ? Bi?t r m?t ly c bao nhiu r??u. ? M?, m?t ly t??ng ???ng v?i m?t chai bia 12 ao x? (355 mL), m?t ly r??u vang 5 ao x? (148 mL), ho?c m?t ly r??u m?nh 1 ao x? (44 mL).  Khng u?ng r??u n?u: ? Chuyn gia ch?m [place] s?c kh?e c?a qu v? khuyn qu v? khng u?ng r??u. ? Qu v? c [place], c th? c [place], ho?c c k? ho?ch c [place]. Ho?t ??ng  T?p th? d?c th??ng xuyn. B??t ??u m?t ch??ng tri?nh t?p th? d?c v luy?n t?p t?ng s?c m?nh c? b?p theo chi? d?n cu?a chuyn gia ch?m so?c s??c kho?e. Hy h?i chuyn gia ch?m [place] s?c kh?e v? cc ho?t ??ng no l an ton cho qu v?. Chuyn gia ch?m [place] s?c kh?e c?a qu v? c th? khuy?n ngh?: ? Ho?t ??ng th? d?c th?m m? 30 pht m?i ngy, 4-6 ngy m?i tu?n. ?i b? nhanh l m?t v d? v? ho?t ??ng th? d?c th?m m?. ? Luy?n t?p t?ng s?c m?nh c? b?p 2 ngy m?i tu?n. H??ng d?n chung  Khng s? d?ng b?t k? s?n ph?m no c nicotine ho?c thu?c l, ch?ng ha?n nh? thu?c l d?ng ht, thu?c l ?i?n t? v thu?c l d?ng nhai. N?u qu v? c?n gip ?? ?? cai thu?c, hy h?i chuyn gia ch?m [place] s?c kh?e.  Ch? s? d?ng thu?c khng k ??n v thu?c k ??n theo ch? d?n c?a chuyn gia ch?m [place] s?c kh?e. ?i?u ny bao g?m cc th?c ph?m b? sung.  Tun th? t?t c? cc l?n khm theo di theo ch? d?n c?a chuyn gia ch?m [place] s?c kh?e. Hy lin l?c v?i chuyn gia ch?m [place] s?c kh?e n?u:  Qu v?: ? G?p v?n ?? v?i vi?c tun th? k? ho?ch t?p th? d?c ho?c ?n [place] c?a mnh. ? ??u tranh ?? b? ht thu?c ho?c ki?m sot vi?c s? d?ng r??u c?a mnh. Tm t?t  R?i lo?n m? mu th??ng ko theo l??ng cc lo?i ch?t bo cao l cholesterol ho?c triglycerides.  ?i?u tr? ph? thu?c vo lo?i r?i lo?n ch?t bo m qu v? b? v cc y?u t? nguy c? khc ??i v?i b?nh tim v ??t qu?.  ??i v?i nhi?u ng??i, ?i?u tr? b?t ??u b?ng vi?c thay ??i l?i s?ng, nh? l ch? ?? ?n v t?p th? d?c.  Chuyn  gia ch?m [place] s?c kh?e c th? k ??n thu?c ?? lm gi?m ch?t bo. Thng tin ny khng nh?m m?c ?ch thay th? cho l?i khuyn m chuyn gia ch?m [place] s?c kh?e ni v?i qu v?. Hy b?o ??m qu v? ph?i th?o lu?n b?t k? v?n ?? g m qu v? c v?i chuyn gia ch?m [place] s?c kh?e c?a qu v?. Document Revised: 10/02/2017 Document Reviewed: 10/02/2017 [employer] Patient Education  2020 [employer].

## 2019-07-03 NOTE — Assessment & Plan Note (Signed)
Well-controlled blood pressure.  Continue present medication, lisinopril 20 mg daily.

## 2019-07-03 NOTE — Assessment & Plan Note (Signed)
Lipid profile done today, 4 hours post prandial.  Will increase rosuvastatin to 20 mg daily.

## 2019-07-04 LAB — COMPREHENSIVE METABOLIC PANEL
ALT: 24 IU/L (ref 0–44)
AST: 26 IU/L (ref 0–40)
Albumin/Globulin Ratio: 1.5 (ref 1.2–2.2)
Albumin: 4.4 g/dL (ref 3.7–4.7)
Alkaline Phosphatase: 90 IU/L (ref 48–121)
BUN/Creatinine Ratio: 13 (ref 10–24)
BUN: 17 mg/dL (ref 8–27)
Bilirubin Total: 0.2 mg/dL (ref 0.0–1.2)
CO2: 22 mmol/L (ref 20–29)
Calcium: 9.5 mg/dL (ref 8.6–10.2)
Chloride: 104 mmol/L (ref 96–106)
Creatinine, Ser: 1.31 mg/dL — ABNORMAL HIGH (ref 0.76–1.27)
GFR calc Af Amer: 60 mL/min/{1.73_m2} (ref >59)
GFR calc non Af Amer: 52 mL/min/{1.73_m2} — ABNORMAL LOW (ref >59)
Globulin, Total: 2.9 g/dL (ref 1.5–4.5)
Glucose: 122 mg/dL — ABNORMAL HIGH (ref 65–99)
Potassium: 4.6 mmol/L (ref 3.5–5.2)
Sodium: 139 mmol/L (ref 134–144)
Total Protein: 7.3 g/dL (ref 6.0–8.5)

## 2019-07-04 LAB — LIPID PANEL
Chol/HDL Ratio: 8.1 ratio — ABNORMAL HIGH (ref 0.0–5.0)
Cholesterol, Total: 293 mg/dL — ABNORMAL HIGH (ref 100–199)
HDL: 36 mg/dL — ABNORMAL LOW (ref >39)
LDL Chol Calc (NIH): 136 mg/dL — ABNORMAL HIGH (ref 0–99)
Triglycerides: 643 mg/dL (ref 0–149)
VLDL Cholesterol Cal: 121 mg/dL — ABNORMAL HIGH (ref 5–40)

## 2019-07-04 LAB — HEMOGLOBIN A1C
Est. average glucose Bld gHb Est-mCnc: 131 mg/dL
Hgb A1c MFr Bld: 6.2 % — ABNORMAL HIGH (ref 4.8–5.6)

## 2019-12-12 DIAGNOSIS — R69 Illness, unspecified: Secondary | ICD-10-CM | POA: Diagnosis not present

## 2020-01-01 ENCOUNTER — Encounter: Payer: Self-pay | Admitting: Emergency Medicine

## 2020-01-01 ENCOUNTER — Ambulatory Visit (INDEPENDENT_AMBULATORY_CARE_PROVIDER_SITE_OTHER): Payer: Medicare HMO | Admitting: Emergency Medicine

## 2020-01-01 ENCOUNTER — Other Ambulatory Visit: Payer: Self-pay

## 2020-01-01 VITALS — BP 130/70 | HR 84 | Temp 98.7°F | Resp 16 | Ht 62.0 in | Wt 129.0 lb

## 2020-01-01 DIAGNOSIS — I10 Essential (primary) hypertension: Secondary | ICD-10-CM

## 2020-01-01 DIAGNOSIS — R7303 Prediabetes: Secondary | ICD-10-CM | POA: Diagnosis not present

## 2020-01-01 DIAGNOSIS — E785 Hyperlipidemia, unspecified: Secondary | ICD-10-CM

## 2020-01-01 MED ORDER — ROSUVASTATIN CALCIUM 20 MG PO TABS: 20.0000 mg | ORAL_TABLET | Freq: Every day | ORAL | 3 refills | Status: DC

## 2020-01-01 MED ORDER — LISINOPRIL 10 MG PO TABS: 10.0000 mg | ORAL_TABLET | Freq: Every day | ORAL | 3 refills | Status: DC

## 2020-01-01 MED ORDER — LISINOPRIL 20 MG PO TABS: 20.0000 mg | ORAL_TABLET | Freq: Every day | ORAL | 3 refills | Status: DC

## 2020-01-01 NOTE — Patient Instructions (Addendum)
If you have lab work done today you will be contacted with your lab results within the next 2 weeks.  If you have not heard from [place] then please contact us. The fastest way to get your results is to register for My Chart.   IF you received an x-ray today, you will receive an invoice from [facility]. Please contact [facility] at [phone] with questions or concerns regarding your invoice.   IF you received labwork today, you will receive an invoice from [place] [facility]. Please contact [facility] at [phone] with questions or concerns regarding your invoice.   Our billing staff will not be able to assist you with questions regarding bills from these companies.  You will be contacted with the lab results as soon as they are available. The fastest way to get your results is to activate your My Chart account. Instructions are located on the last page of this paperwork. If you have not heard from [place] regarding the results in 2 weeks, please contact this office.     T?ng huy?t p, Ng??i l?n Hypertension, Adult Huy?t p cao (t?ng huy?t p) l khi l?c b?m mu qua ??ng m?ch qu m?nh. ??ng m?ch l cc m?ch mu mang mu t? tim ?i kh?p c? th?. T?ng huy?t p khi?n tim lm vi?c v?t v? h?n ?? b?m mu v c th? khi?n cc ??ng m?ch tr? ln h?p ho?c c?ng. T?ng huy?t p khng ???c ?i?u tr? ho?c ki?m sot c th? d?n t?i nh?i mu c? tim, suy tim, ??t qu?, b?nh th?n v nh?ng v?n ?? khc. Ch? s? ?o huy?t p g?m m?t ch? s? cao trn m?t ch? s? th?p. L t??ng l huy?t a?p cu?a quy? vi? c?n ph?i d??i 120/80. Ch? s? ??u tin ("??nh") ???c g?i l huy?t p tm thu. ?y l s? ?o p su?t trong ??ng m?ch khi tim qu v? ??p. Ch? s? th? hai ("?y") ???c g?i l huy?t p tm tr??ng. ?y l s? ?o p su?t trong ??ng m?ch khi tim qu v? ngh?Lourdes Sledge nhn g gy ra? Khng r nguyn nhn chnh xc gy ra tnh tr?ng ny. C m?t s? tnh tr?ng d?n ??n ho?c c lin quan ??n huy?t p cao. ?i?u g lm t?ng nguy c?? M?t  s? y?u t? nguy c? d?n ??n huy?t p cao c th? ki?m sot ???c. Nh?ng y?u t? sau c th? lm qu v? d? b? tnh tr?ng ny h?n:  Ht thu?c.  B? b?nh ti?u ???ng tup 2, cholesterol cao, ho?c c? hai.  Khng t?p th? d?c ho?c cc ho?t ??ng th? ch?t ??y ??Marland Kitchen  Th?a cn.  ?n qu nhi?u ch?t bo, ???ng, ca-lo, ho?c mu?i (Natri).  U?ng qu nhi?u r??u. M?t s? y?u t? nguy c? b? huy?t p cao c th? kh ho?c khng th? thay ??i. M?t s? y?u t? trong s? ny bao g?m:  B?nh th?n m?n tnh.  C ti?n s? gia ?nh b? cao huy?t p.  ?? tu?i. Nguy c? t?ng ln theo ?? tu?i.  Ch?ng t?c. Qu v? c nguy c? cao h?n n?u qu v? l ng??i M? g?c Phi.  Gi?i tnh. Nam gi?i c nguy c? cao h?n ph? n? tr??c tu?i 46. Sau tu?i 66, ph? n? c nguy c? cao h?n nam gi?i.  Ng?ng th? khi ng? do t?c ngh?n.  C?ng th?ng. Cc d?u hi?u ho?c tri?u ch?ng g? Huy?t p cao c th? khng gy ra cc tri?u ch?ng. Huy?t p r?t cao (c?n t?ng  huy?t p) c th? gy ra:  ?au ??u.  Lo u.  Kh th?.  Ch?y mu cam.  Bu?n nn v nn m?a.  Th? l?c thay ??i.  ?au ng?c d? d?i.  Co gi?t. Ch?n ?on tnh tr?ng ny nh? th? no? Tnh tr?ng ny ???c ch?n ?on b?ng cch ?o huy?t p c?a qu v? lc qu v? ng?i, ?? tay trn m?t b? m?t ph?ng, hai chn khng b?t cho, v hai bn chn b?ng ph?ng trn sn. B?ng qu?n thi?t b? ?o huy?t p s? ???c qu?n tr?c ti?p vo vng da cnh tay pha trn c?a qu v? ngang v?i m?c tim. Huy?t p c?n ???c ?o t nh?t hai l?n trn cng m?t cnh tay. M?t s? tnh tr?ng nh?t ??nh c th? lm cho huy?t p khc nhau gi?a tay ph?i v tay tri c?a qu v?. M?t s? y?u t? nh?t ??nh c th? khi?n ch? s? ?o huy?t p th?p h?n ho?c cao h?n so v?i bnh th??ng trong th?i gian ng?n:  Khi huy?t p c?a qu v? ? phng khm c?a chuyn gia ch?m sc s?c kh?e cao h?n so v?i lc qu v? ? nh, hi?n t??ng ny ???c g?i l t?ng huy?t p o chong tr?ng. H?u h?t nh?ng ng??i b? tnh tr?ng ny ??u khng c?n dng thu?c.  Khi huy?t p c?a qu v? lc ? nh cao h?n  so v?i lc qu v? ? phng khm chuyn gia ch?m sc s?c kh?e, hi?n t??ng ny ???c g?i l t?ng huy?t p ?n. H?u h?t nh?ng ng??i b? tnh tr?ng ny ??u c th? c?n dng thu?c ?? ki?m sot huy?t p. N?u qu v? c ch? s? huy?t p cao trong m?t l?n khm ho?c qu v? c huy?t p bnh th??ng c km cc y?u t? nguy c? khc, qu v? c th? ???c yu c?u:  Tr? l?i vo m?t ngy khc ?? ki?m tra l?i huy?t p.  Theo di huy?t p t?i nh trong vng 1 tu?n ho?c lu h?n. N?u qu v? ???c ch?n ?on b? t?ng huy?t p, qu v? c th? c?n th?c hi?n cc xt nghi?m mu ho?c ki?m tra hnh ?nh khc ?? gip chuyn gia ch?m sc s?c kh?e hi?u nguy c? t?ng th? m?c cc b?nh tr?ng khc. Tnh tr?ng ny ???c ?i?u tr? nh? th? no? Tnh tr?ng ny ???c ?i?u tr? b?ng cch thay ??i l?i s?ng lnh m?nh, ch?ng h?nh nh? ?n th?c ph?m c l?i cho s?c kh?e, t?p th? d?c nhi?u h?n v gi?m l??ng r??u u?ng vo. N?u thay ??i l?i s?ng khng ?? ?? ??a huy?t p v? m?c c th? ki?m sot ???c, chuyn gia ch?m sc s?c kh?e c th? k ??n thu?c, v n?u:  Huy?t p tm thu c?a qu v? trn 130.  Huy?t p tm tr??ng c?a qu v? trn 80. Huy?t p m?c tiu c nhn c?a qu v? c th? khc nhau ty thu?c v tnh tr?ng b?nh l, tu?i v cc nhn t? khc. Tun th? nh?ng h??ng d?n ny ? nh: ?n v u?ng   ?n ch? ?? giu ch?t x? v kali v t natri, ???ng ph? gia v ch?t bo. M?t k? ho?ch ?n m?u c tn l ch? ?? ?n DASH (Cch ti?p c?n ch? ?? ?n u?ng ?? ng?n ch?n t?ng huy?t p). ?n theo cch ny: ? ?n nhi?u tri cy v rau t??i. Vo m?i b?a ?n, c? g?ng dnh m?t n?a ??a cho tri cy v rau c?. ? ?n ng? c?c nguyn cm,   ch?ng h?n nh? m ?ng lm t? b?t m nguyn cm, g?o l?t, ho?c bnh m t? b?t m nguyn cm. Cho ngu? c?c nguyn ca?m va?o kho?ng m?t ph?n t? ??a. ? ?n ho?c hu?ng cc s?n ph?m t? s?a t bo, ch?ng h?n nh? s?a ? b? kem ho?c s?a chua t bo. ? Trnh nh?ng mi?ng th?t nhi?u m?, th?t ? qua ch? bi?n ho?c th?t ??p mu?i v th?t gia c?m c da. Dnh kho?ng m?t ph?n t? ??a  c?a qu v? cho cc protein khng m?, ch?ng h?n nh? c, th?t g khng da, ??u, tr?ng, ho?c ??u ph?. ? Trnh nh?ng th?c ph?m ch? bi?n ho?c lm s?n. Nh?ng th?c ph?m ny th??ng c nhi?u natri, ???ng ph? gia v ch?t bo h?n.  Gi?m l??ng dng natri hng ngy c?a qu v?. H?u h?t nh?ng ng??i b? t?ng huy?t p ??u nn ?n d??i 1.500 mg natri m?i ngy.  Khng u?ng r??u n?u: ? Chuyn gia ch?m sc s?c kh?e c?a qu v? khuyn qu v? khng u?ng r??u. ? Qu v? c thai, c th? c thai, ho?c c k? ho?ch c thai.  N?u qu v? u?ng r??u: ? Gi?i h?n l??ng r??u qu v? u?ng ? m?c:  0-1 ly/ngy ??i v?i n? gi?i.  0-2 ly/ngy ??i v?i nam gi?i. ? Bi?t r m?t ly c bao nhiu r??u. ? M?, m?t ly t??ng ???ng v?i m?t chai bia 12 ao x? (355 mL), m?t ly r??u vang 5 ao x? (148 mL), ho?c m?t ly r??u m?nh 1 ao x? (44 mL). L?i s?ng   H?p tc v?i chuyn gia ch?m sc s?c kh?e c?a qu v? ?? duy tr tr?ng l??ng c? th? c l?i cho s?c kh?e ho?c ?? gi?m cn. Hy h?i xem tr?ng l??ng no l l t??ng cho qu v?.  T?p th? d?c t nh?t 30 pht h?u h?t cc ngy trong tu?n. Cc ho?t ??ng c th? bao g?m ?i b?, b?i, ho?c ??p xe.  Bao g?m bi t?p t?ng c??ng c? (bi t?p khng l?c), ch?ng h?n nh? bi t?p [place] ho?c nng t?, nh? m?t ph?n c?a thi quen luy?n t?p hng tu?n c?a qu v?. C? g?ng t?p nh?ng lo?i bi t?p ny trong vng 30 pht t?i thi?u 3 ngy m?i tu?n.  Khng s? d?ng b?t k? s?n ph?m no c nicotine ho?c thu?c l, ch?ng ha?n nh? thu?c l d?ng ht, thu?c l ?i?n t? v thu?c l d?ng nhai. N?u qu v? c?n gip ?? ?? cai thu?c, hy h?i chuyn gia ch?m sc s?c kh?e.  Theo di huy?t p c?a qu v? t?i nh theo h??ng d?n c?a chuyn gia ch?m sc s?c kh?e.  Tun th? t?t c? cc l?n khm theo di theo ch? d?n c?a chuyn gia ch?m sc s?c kh?e. ?i?u ny c vai tr quan tr?ng. Thu?c  Ch? s? d?ng thu?c khng k ??n v thu?c k ??n theo ch? d?n c?a chuyn gia ch?m sc s?c kh?e. Lm theo ch? d?n m?t cch c?n th?n. Thu?c ?i?u tr? huy?t p ph?i ???c dng  theo ??n ? k.  Khng b? cc li?u thu?c huy?t p. Vi?c b? dng thu?c khi?n qu v? c nguy c? g?p ph?i cc v?n ?? v c th? lm cho thu?c gi?m hi?u qu?.  Hy h?i chuyn gia ch?m sc s?c kh?e c?a qu v? v? nh?ng tc d?ng ph? ho?c ph?n ?ng v?i thu?c m qu v? ph?i theo di. Hy lin l?c v?i chuyn gia ch?m sc s?c kh?e n?u qu v?:    Ngh? qu v? c ph?n ?ng v?i thu?c ?ang dng.  B? ?au ??u ti?p t?c tr? l?i (ti pht).  C?m th?y chng m?t.  B? s?ng ph ? c? chn.  C v?n ?? v? th? l?c. Yu c?u tr? gip ngay l?p t?c n?u qu v?:  B? ?au ??u r?t nhi?u ho?c l l?n.  B? y?u ho?c t b b?t th??ng.  C?m th?y ng?t x?u.  B? ?au r?t nhi?u ? ng?c ho?c b?ng.  Nn nhi?u l?n.  B? kh th?. Tm t?t  T?ng huy?t p l khi l?c b?m mu qua cc ??ng m?ch c?a qu v? qu m?nh. N?u tnh tr?ng ny khng ???c ki?m sot, n c th? khi?n qu v? c nguy c? v? cc bi?n ch?ng nghim tr?ng.  Huy?t p m?c tiu c nhn c?a qu v? c th? khc nhau ty thu?c v tnh tr?ng b?nh l, tu?i v cc nhn t? khc. ??i v?i h?u h?t m?i ng??i, huy?t p bnh th??ng l d??i 120/80.  ?i?u tr? t?ng huy?t p b?ng cch thay ??i l?i s?ng, dng thu?c, ho?c k?t h?p c? hai. Thay ??i l?i s?ng bao g?m gi?m cn, ?n ch? ?? ?n c l?i cho s?c kh?e, t natri, t?p th? d?c nhi?u h?n v h?n ch? u?ng r??u. Thng tin ny khng nh?m m?c ?ch thay th? cho l?i khuyn m chuyn gia ch?m [place] s?c kh?e ni v?i qu v?. Hy b?o ??m qu v? ph?i th?o lu?n b?t k? v?n ?? g m qu v? c v?i chuyn gia ch?m [place] s?c kh?e c?a qu v?. Document Revised: 11/09/2017 Document Reviewed: 11/09/2017 Elsevier Patient Education  2020 [employer].

## 2020-01-01 NOTE — Progress Notes (Signed)
Kenneth Moore 79 y.o.   Chief Complaint  Patient presents with   Hypertension    follow up 6 month   Medication Refill    Lisinopril and Rosuvastatin    HISTORY OF PRESENT ILLNESS: This is a 79 y.o. male with history of hypertension and dyslipidemia here for follow-up and medication refill. Doing well has no complaints or medical concerns today. Has been taking lisinopril 10 mg instead of 20 mg due to low blood pressure readings at home.  Wants me to change prescription to 10 mg tablets. Previous blood work reviewed.  Very abnormal lipid profile.  Dose of rosuvastatin was increased to 20 mg daily.  Will recheck today.  HPI   Prior to Admission medications   Medication Sig Start Date End Date Taking? Authorizing Provider  lisinopril (ZESTRIL) 20 MG tablet Take 1 tablet (20 mg total) by mouth daily. 07/03/19  Yes Timmie Calix, Eilleen Kempf, MD  rosuvastatin (CRESTOR) 20 MG tablet Take 1 tablet (20 mg total) by mouth daily. 07/03/19  Yes Georgina Quint, MD    No Known Allergies  Patient Active Problem List   Diagnosis Date Noted   Dyslipidemia 01/02/2019   Prediabetes 01/02/2019   Essential hypertension 06/24/2018   Arthritis, shoulder region 11/07/2016    Past Medical History:  Diagnosis Date   Hypertension     No past surgical history on file.  Social History   Socioeconomic History   Marital status: Married    Spouse name: Not on file   Number of children: 3   Years of education: Not on file   Highest education level: Not on file  Occupational History   Not on file  Tobacco Use   Smoking status: Never Smoker   Smokeless tobacco: Never Used  Vaping Use   Vaping Use: Never used  Substance and Sexual Activity   Alcohol use: No   Drug use: Not Currently   Sexual activity: Not Currently  Other Topics Concern   Not on file  Social History Narrative   Not on file   Social Determinants of Health   Financial Resource Strain:     Difficulty of Paying Living Expenses: Not on file  Food Insecurity:    Worried About Running Out of Food in the Last Year: Not on file   Ran Out of Food in the Last Year: Not on file  Transportation Needs:    Lack of Transportation (Medical): Not on file   Lack of Transportation (Non-Medical): Not on file  Physical Activity:    Days of Exercise per Week: Not on file   Minutes of Exercise per Session: Not on file  Stress:    Feeling of Stress : Not on file  Social Connections:    Frequency of Communication with Friends and Family: Not on file   Frequency of Social Gatherings with Friends and Family: Not on file   Attends Religious Services: Not on file   Active Member of Clubs or Organizations: Not on file   Attends Banker Meetings: Not on file   Marital Status: Not on file  Intimate Partner Violence:    Fear of Current or Ex-Partner: Not on file   Emotionally Abused: Not on file   Physically Abused: Not on file   Sexually Abused: Not on file    No family history on file.   Review of Systems  Constitutional: Negative.  Negative for chills and fever.  HENT: Negative.  Negative for congestion and sore throat.   Respiratory:  Negative.  Negative for cough and shortness of breath.   Cardiovascular: Negative.  Negative for chest pain and palpitations.  Gastrointestinal: Negative.  Negative for abdominal pain, diarrhea, nausea and vomiting.  Genitourinary: Negative.  Negative for dysuria and hematuria.  Musculoskeletal: Negative.  Negative for myalgias and neck pain.  Skin: Negative.  Negative for rash.  Neurological: Negative.  Negative for dizziness and headaches.  All other systems reviewed and are negative.   Today's Vitals   01/01/20 1625  BP: (!) 147/77  Pulse: 84  Resp: 16  Temp: 98.7 F (37.1 C)  TempSrc: Temporal  SpO2: 99%  Weight: 129 lb (58.5 kg)  Height: 5\' 2"  (1.575 m)   Body mass index is 23.59 kg/m. BP Readings from Last 3  Encounters:  01/01/20 (!) 147/77  07/03/19 134/72  01/02/19 (!) 152/85    Physical Exam Vitals reviewed.  Constitutional:      Appearance: Normal appearance.  HENT:     Head: Normocephalic.  Eyes:     Extraocular Movements: Extraocular movements intact.     Conjunctiva/sclera: Conjunctivae normal.     Pupils: Pupils are equal, round, and reactive to light.  Cardiovascular:     Rate and Rhythm: Normal rate and regular rhythm.     Pulses: Normal pulses.     Heart sounds: Normal heart sounds.  Pulmonary:     Effort: Pulmonary effort is normal.     Breath sounds: Normal breath sounds.  Abdominal:     Palpations: Abdomen is soft.     Tenderness: There is no abdominal tenderness.  Musculoskeletal:        General: Normal range of motion.     Cervical back: Normal range of motion and neck supple.     Right lower leg: No edema.     Left lower leg: No edema.  Skin:    General: Skin is warm and dry.     Capillary Refill: Capillary refill takes less than 2 seconds.  Neurological:     General: No focal deficit present.     Mental Status: He is alert and oriented to person, place, and time.  Psychiatric:        Mood and Affect: Mood normal.        Behavior: Behavior normal.    A total of 30 minutes was spent with the patient, greater than 50% of which was in counseling/coordination of care regarding hypertension and dyslipidemia and cardiovascular risks associated with these conditions, review of all medications and dose changes, review of most recent office visit notes, review of most recent blood work results, education on nutrition, documentation, prognosis, and need for follow-up.   ASSESSMENT & PLAN: Essential hypertension Well-controlled hypertension.  Continue present medication. Follow-up in 6 months. Annell GreeningDinh was seen today for hypertension and medication refill.  Diagnoses and all orders for this visit:  Essential hypertension -     Comprehensive metabolic panel -      Discontinue: lisinopril (ZESTRIL) 20 MG tablet; Take 1 tablet (20 mg total) by mouth daily. -     lisinopril (ZESTRIL) 10 MG tablet; Take 1 tablet (10 mg total) by mouth daily.  Dyslipidemia -     Lipid panel -     rosuvastatin (CRESTOR) 20 MG tablet; Take 1 tablet (20 mg total) by mouth daily.  Prediabetes -     Hemoglobin A1c    Patient Instructions       If you have lab work done today you will be contacted with your lab  results within the next 2 weeks.  If you have not heard from [place] then please contact us. The fastest way to get your results is to register for My Chart.   IF you received an x-ray today, you will receive an invoice from [facility]. Please contact [facility] at [phone] with questions or concerns regarding your invoice.   IF you received labwork today, you will receive an invoice from [place]. Please contact [facility] at [phone] with questions or concerns regarding your invoice.   Our billing staff will not be able to assist you with questions regarding bills from these companies.  You will be contacted with the lab results as soon as they are available. The fastest way to get your results is to activate your My Chart account. Instructions are located on the last page of this paperwork. If you have not heard from [place] regarding the results in 2 weeks, please contact this office.     T?ng huy?t p, Ng??i l?n Hypertension, Adult Huy?t p cao (t?ng huy?t p) l khi l?c b?m mu qua ??ng m?ch qu m?nh. ??ng m?ch l cc m?ch mu mang mu t? tim ?i kh?p c? th?. T?ng huy?t p khi?n tim lm vi?c v?t v? h?n ?? b?m mu v c th? khi?n cc ??ng m?ch tr? ln h?p ho?c c?ng. T?ng huy?t p khng ???c ?i?u tr? ho?c ki?m sot c th? d?n t?i nh?i mu c? tim, suy tim, ??t qu?, b?nh th?n v nh?ng v?n ?? khc. Ch? s? ?o huy?t p g?m m?t ch? s? cao trn m?t ch? s? th?p. L t??ng l huy?t a?p cu?a quy? vi? c?n ph?i d??i 120/80. Ch? s? ??u tin ("??nh") ???c  g?i l huy?t p tm thu. ?y l s? ?o p su?t trong ??ng m?ch khi tim qu v? ??p. Ch? s? th? hai ("?y") ???c g?i l huy?t p tm tr??ng. ?y l s? ?o p su?t trong ??ng m?ch khi tim qu v? ngh?[place] nhn g gy ra? Khng r nguyn nhn chnh xc gy ra tnh tr?ng ny. C m?t s? tnh tr?ng d?n ??n ho?c c lin quan ??n huy?t p cao. ?i?u g lm t?ng nguy c?? M?t s? y?u t? nguy c? d?n ??n huy?t p cao c th? ki?m sot ???c. Nh?ng y?u t? sau c th? lm qu v? d? b? tnh tr?ng ny h?n:  Ht thu?c.  B? b?nh ti?u ???ng tup 2, cholesterol cao, ho?c c? hai.  Khng t?p th? d?c ho?c cc ho?t ??ng th? ch?t ??y ??Marland Kitchen  Th?a cn.  ?n qu nhi?u ch?t bo, ???ng, ca-lo, ho?c mu?i (Natri).  U?ng qu nhi?u r??u. M?t s? y?u t? nguy c? b? huy?t p cao c th? kh ho?c khng th? thay ??i. M?t s? y?u t? trong s? ny bao g?m:  B?nh th?n m?n tnh.  C ti?n s? gia ?nh b? cao huy?t p.  ?? tu?i. Nguy c? t?ng ln theo ?? tu?i.  Ch?ng t?c. Qu v? c nguy c? cao h?n n?u qu v? l ng??i M? g?c Phi.  Gi?i tnh. Nam gi?i c nguy c? cao h?n ph? n? tr??c tu?i 46. Sau tu?i 66, ph? n? c nguy c? cao h?n nam gi?i.  Ng?ng th? khi ng? do t?c ngh?n.  C?ng th?ng. Cc d?u hi?u ho?c tri?u ch?ng g? Huy?t p cao c th? khng gy ra cc tri?u ch?ng. Huy?t p r?t cao (c?n t?ng huy?t p) c th? gy ra:  ?au ??u.  Lo u.  Kh th?.  Ch?y  mu cam.  Bu?n nn v nn m?a.  Th? l?c thay ??i.  ?au ng?c d? d?i.  Co gi?t. Ch?n ?on tnh tr?ng ny nh? th? no? Tnh tr?ng ny ???c ch?n ?on b?ng cch ?o huy?t p c?a qu v? lc qu v? ng?i, ?? tay trn m?t b? m?t ph?ng, hai chn khng b?t cho, v hai bn chn b?ng ph?ng trn sn. B?ng qu?n thi?t b? ?o huy?t p s? ???c qu?n tr?c ti?p vo vng da cnh tay pha trn c?a qu v? ngang v?i m?c tim. Huy?t p c?n ???c ?o t nh?t hai l?n trn cng m?t cnh tay. M?t s? tnh tr?ng nh?t ??nh c th? lm cho huy?t p khc nhau gi?a tay ph?i v tay tri c?a qu v?. M?t s? y?u t? nh?t  ??nh c th? khi?n ch? s? ?o huy?t p th?p h?n ho?c cao h?n so v?i bnh th??ng trong th?i gian ng?n:  Khi huy?t p c?a qu v? ? phng khm c?a chuyn gia ch?m [place] s?c kh?e cao h?n so v?i lc qu v? ? nh, hi?n t??ng ny ???c g?i l t?ng huy?t p o chong tr?ng. H?u h?t nh?ng ng??i b? tnh tr?ng ny ??u khng c?n dng thu?c.  Khi huy?t p c?a qu v? lc ? nh cao h?n so v?i lc qu v? ? phng khm chuyn gia ch?m [place] s?c kh?e, hi?n t??ng ny ???c g?i l t?ng huy?t p ?n. H?u h?t nh?ng ng??i b? tnh tr?ng ny ??u c th? c?n dng thu?c ?? ki?m sot huy?t p. N?u qu v? c ch? s? huy?t p cao trong m?t l?n khm ho?c qu v? c huy?t p bnh th??ng c km cc y?u t? nguy c? khc, qu v? c th? ???c yu c?u:  Tr? l?i vo m?t ngy khc ?? ki?m tra l?i huy?t p.  Theo di huy?t p t?i nh trong vng 1 tu?n ho?c lu h?n. N?u qu v? ???c ch?n ?on b? t?ng huy?t p, qu v? c th? c?n th?c hi?n cc xt nghi?m mu ho?c ki?m tra hnh ?nh khc ?? gip chuyn gia ch?m [place] s?c kh?e hi?u nguy c? t?ng th? m?c cc b?nh tr?ng khc. Tnh tr?ng ny ???c ?i?u tr? nh? th? no? Tnh tr?ng ny ???c ?i?u tr? b?ng cch thay ??i l?i s?ng lnh m?nh, ch?ng h?nh nh? ?n th?c ph?m c l?i cho s?c kh?e, t?p th? d?c nhi?u h?n v gi?m l??ng r??u u?ng vo. N?u thay ??i l?i s?ng khng ?? ?? ??a huy?t p v? m?c c th? ki?m sot ???c, chuyn gia ch?m [place] s?c kh?e c th? k ??n thu?c, v n?u:  Huy?t p tm thu c?a qu v? trn 130.  Huy?t p tm tr??ng c?a qu v? trn 80. Huy?t p m?c tiu c nhn c?a qu v? c th? khc nhau ty thu?c v tnh tr?ng b?nh l, tu?i v cc nhn t? khc. Tun th? nh?ng h??ng d?n ny ? nh: ?n v u?ng   ?n ch? ?? giu ch?t x? v kali v t natri, ???ng ph? gia v ch?t bo. M?t k? ho?ch ?n m?u c tn l ch? ?? ?n DASH (Cch ti?p c?n ch? ?? ?n u?ng ?? ng?n ch?n t?ng huy?t p). ?n theo cch ny: ? ?n nhi?u tri cy v rau t??i. Vo m?i b?a ?n, c? g?ng dnh m?t n?a ??a cho tri cy v rau c?. ? ?n ng? c?c nguyn  cm, ch?ng h?n nh? m ?ng lm t? b?t m nguyn cm, g?o l?t, ho?c bnh m  t? b?t m nguyn cm. Cho ngu? c?c nguyn ca?m va?o kho?ng m?t ph?n t? ??a. ? ?n ho?c hu?ng cc s?n ph?m t? s?a t bo, ch?ng h?n nh? s?a ? b? kem ho?c s?a chua t bo. ? Trnh nh?ng mi?ng th?t nhi?u m?, th?t ? qua ch? bi?n ho?c th?t ??p mu?i v th?t gia c?m c da. Dnh kho?ng m?t ph?n t? ??a c?a qu v? cho cc protein khng m?, ch?ng h?n nh? c, th?t g khng da, ??u, tr?ng, ho?c ??u ph?. ? Trnh nh?ng th?c ph?m ch? bi?n ho?c lm s?n. Nh?ng th?c ph?m ny th??ng c nhi?u natri, ???ng ph? gia v ch?t bo h?n.  Gi?m l??ng dng natri hng ngy c?a qu v?. H?u h?t nh?ng ng??i b? t?ng huy?t p ??u nn ?n d??i 1.500 mg natri m?i ngy.  Khng u?ng r??u n?u: ? Chuyn gia ch?m [place] s?c kh?e c?a qu v? khuyn qu v? khng u?ng r??u. ? Qu v? c [place], c th? c [place], ho?c c k? ho?ch c [place].  N?u qu v? u?ng r??u: ? Gi?i h?n l??ng r??u qu v? u?ng ? m?c:  0-1 ly/ngy ??i v?i n? gi?i.  0-2 ly/ngy ??i v?i nam gi?i. ? Bi?t r m?t ly c bao nhiu r??u. ? M?, m?t ly t??ng ???ng v?i m?t chai bia 12 ao x? (355 mL), m?t ly r??u vang 5 ao x? (148 mL), ho?c m?t ly r??u m?nh 1 ao x? (44 mL). L?i s?ng   H?p tc v?i chuyn gia ch?m [place] s?c kh?e c?a qu v? ?? duy tr tr?ng l??ng c? th? c l?i cho s?c kh?e ho?c ?? gi?m cn. Hy h?i xem tr?ng l??ng no l l t??ng cho qu v?.  T?p th? d?c t nh?t 30 pht h?u h?t cc ngy trong tu?n. Cc ho?t ??ng c th? bao g?m ?i b?, b?i, ho?c ??p xe.  Bao g?m bi t?p t?ng c??ng c? (bi t?p khng l?c), ch?ng h?n nh? bi t?p [place] ho?c nng t?, nh? m?t ph?n c?a thi quen luy?n t?p hng tu?n c?a qu v?. C? g?ng t?p nh?ng lo?i bi t?p ny trong vng 30 pht t?i thi?u 3 ngy m?i tu?n.  Khng s? d?ng b?t k? s?n ph?m no c nicotine ho?c thu?c l, ch?ng ha?n nh? thu?c l d?ng ht, thu?c l ?i?n t? v thu?c l d?ng nhai. N?u qu v? c?n gip ?? ?? cai thu?c, hy h?i chuyn gia ch?m [place] s?c kh?e.  Theo di  huy?t p c?a qu v? t?i nh theo h??ng d?n c?a chuyn gia ch?m [place] s?c kh?e.  Tun th? t?t c? cc l?n khm theo di theo ch? d?n c?a chuyn gia ch?m [place] s?c kh?e. ?i?u ny c vai tr quan tr?ng. Thu?c  Ch? s? d?ng thu?c khng k ??n v thu?c k ??n theo ch? d?n c?a chuyn gia ch?m [place] s?c kh?e. Lm theo ch? d?n m?t cch c?n th?n. Thu?c ?i?u tr? huy?t p ph?i ???c dng theo ??n ? k.  Khng b? cc li?u thu?c huy?t p. Vi?c b? dng thu?c khi?n qu v? c nguy c? g?p ph?i cc v?n ?? v c th? lm cho thu?c gi?m hi?u qu?[employer] [employer]  Hy h?i chuyn gia ch?m [place] s?c kh?e c?a qu v? v? nh?ng tc d?ng ph? ho?c ph?n ?ng v?i thu?c m qu v? ph?i theo di. Hy lin l?c v?i chuyn gia ch?m  s?c kh?e n?u qu v?:  Ngh? qu v? c ph?n ?ng v?i thu?c ?ang dng.  B? ?au ??u ti?p t?c  tr? l?i (ti pht).  C?m th?y chng m?t.  B? s?ng ph ? c? chn.  C v?n ?? v? th? l?c. Yu c?u tr? gip ngay l?p t?c n?u qu v?:  B? ?au ??u r?t nhi?u ho?c l l?n.  B? y?u ho?c t b b?t th??ng.  C?m th?y ng?t x?u.  B? ?au r?t nhi?u ? ng?c ho?c b?ng.  Nn nhi?u l?n.  B? kh th?. Tm t?t  T?ng huy?t p l khi l?c b?m mu qua cc ??ng m?ch c?a qu v? qu m?nh. N?u tnh tr?ng ny khng ???c ki?m sot, n c th? khi?n qu v? c nguy c? v? cc bi?n ch?ng nghim tr?ng.  Huy?t p m?c tiu c nhn c?a qu v? c th? khc nhau ty thu?c v tnh tr?ng b?nh l, tu?i v cc nhn t? khc. ??i v?i h?u h?t m?i ng??i, huy?t p bnh th??ng l d??i 120/80.  ?i?u tr? t?ng huy?t p b?ng cch thay ??i l?i s?ng, dng thu?c, ho?c k?t h?p c? hai. Thay ??i l?i s?ng bao g?m gi?m cn, ?n ch? ?? ?n c l?i cho s?c kh?e, t natri, t?p th? d?c nhi?u h?n v h?n ch? u?ng r??u. Thng tin ny khng nh?m m?c ?ch thay th? cho l?i khuyn m chuyn gia ch?m [place] s?c kh?e ni v?i qu v?. Hy b?o ??m qu v? ph?i th?o lu?n b?t k? v?n ?? g m qu v? c v?i chuyn gia ch?m  s?c kh?e c?a qu v?. Document Revised: 11/09/2017 Document Reviewed:  11/09/2017 [employer] Patient Education  2020 [employer]      Edwina Barth, MD [facility]

## 2020-01-01 NOTE — Assessment & Plan Note (Signed)
Well-controlled hypertension.  Continue present medication. Follow-up in 6 months.

## 2020-01-02 LAB — COMPREHENSIVE METABOLIC PANEL
ALT: 21 IU/L (ref 0–44)
AST: 20 IU/L (ref 0–40)
Albumin/Globulin Ratio: 1.6 (ref 1.2–2.2)
Albumin: 4.4 g/dL (ref 3.7–4.7)
Alkaline Phosphatase: 87 IU/L (ref 44–121)
BUN/Creatinine Ratio: 15 (ref 10–24)
BUN: 19 mg/dL (ref 8–27)
Bilirubin Total: 0.3 mg/dL (ref 0.0–1.2)
CO2: 23 mmol/L (ref 20–29)
Calcium: 9.2 mg/dL (ref 8.6–10.2)
Chloride: 105 mmol/L (ref 96–106)
Creatinine, Ser: 1.24 mg/dL (ref 0.76–1.27)
GFR calc Af Amer: 64 mL/min/{1.73_m2} (ref >59)
GFR calc non Af Amer: 55 mL/min/{1.73_m2} — ABNORMAL LOW (ref >59)
Globulin, Total: 2.7 g/dL (ref 1.5–4.5)
Glucose: 106 mg/dL — ABNORMAL HIGH (ref 65–99)
Potassium: 4.5 mmol/L (ref 3.5–5.2)
Sodium: 141 mmol/L (ref 134–144)
Total Protein: 7.1 g/dL (ref 6.0–8.5)

## 2020-01-02 LAB — LIPID PANEL
Chol/HDL Ratio: 5 ratio (ref 0.0–5.0)
Cholesterol, Total: 209 mg/dL — ABNORMAL HIGH (ref 100–199)
HDL: 42 mg/dL (ref >39)
LDL Chol Calc (NIH): 104 mg/dL — ABNORMAL HIGH (ref 0–99)
Triglycerides: 372 mg/dL — ABNORMAL HIGH (ref 0–149)
VLDL Cholesterol Cal: 63 mg/dL — ABNORMAL HIGH (ref 5–40)

## 2020-01-02 LAB — HEMOGLOBIN A1C
Est. average glucose Bld gHb Est-mCnc: 131 mg/dL
Hgb A1c MFr Bld: 6.2 % — ABNORMAL HIGH (ref 4.8–5.6)

## 2020-05-07 ENCOUNTER — Other Ambulatory Visit: Payer: Self-pay

## 2020-05-07 ENCOUNTER — Ambulatory Visit (HOSPITAL_COMMUNITY)
Admission: EM | Admit: 2020-05-07 | Discharge: 2020-05-07 | Disposition: A | Discharge status: Home / Self Care | Payer: Medicare HMO | Attending: Family Medicine | Admitting: Family Medicine

## 2020-05-07 ENCOUNTER — Encounter (HOSPITAL_COMMUNITY): Payer: Self-pay

## 2020-05-07 ENCOUNTER — Ambulatory Visit (INDEPENDENT_AMBULATORY_CARE_PROVIDER_SITE_OTHER): Payer: Medicare HMO

## 2020-05-07 DIAGNOSIS — M19012 Primary osteoarthritis, left shoulder: Secondary | ICD-10-CM | POA: Diagnosis not present

## 2020-05-07 DIAGNOSIS — M25512 Pain in left shoulder: Secondary | ICD-10-CM

## 2020-05-07 MED ORDER — TRIAMCINOLONE ACETONIDE 40 MG/ML IJ SUSP
INTRAMUSCULAR | Status: AC
  Filled 2020-05-07: qty 1

## 2020-05-07 MED ORDER — TRIAMCINOLONE ACETONIDE 40 MG/ML IJ SUSP
40.0000 mg | Freq: Once | INTRAMUSCULAR | Status: AC
  Administered 2020-05-07: 40 mg via INTRAMUSCULAR

## 2020-05-07 MED ORDER — NAPROXEN 500 MG PO TABS: 500.0000 mg | ORAL_TABLET | Freq: Two times a day (BID) | ORAL | 0 refills | Status: AC

## 2020-05-07 NOTE — ED Provider Notes (Signed)
[facility]    CSN: [id number] Arrival date & time: 05/07/20  1351      History   Chief Complaint Chief Complaint  Patient presents with  . Shoulder Pain    HPI Kenneth Moore is a 80 y.o. male.   Patient declines medical interpreter today, request that daughter translates for him today.  She states he has been having about 2 weeks of left shoulder pain, worse with movement.  He denies swelling, redness, numbness, tingling, weakness, new injury to the area.  Has been taking glucosamine/chondroitin supplements without any benefit.  Has had a history of left shoulder pain in the past per chart review and x-rays have shown degenerative changes that PCP had done in the past.     Past Medical History:  Diagnosis Date  . Hypertension     Patient Active Problem List   Diagnosis Date Noted  . Dyslipidemia 01/02/2019  . Prediabetes 01/02/2019  . Essential hypertension 06/24/2018  . Arthritis, shoulder region 11/07/2016    History reviewed. No pertinent surgical history.     Home Medications    Prior to Admission medications   Medication Sig Start Date End Date Taking? Authorizing Provider  naproxen (NAPROSYN) 500 MG tablet Take 1 tablet (500 mg total) by mouth 2 (two) times daily. 05/07/20  Yes Particia Nearing, PA-C  lisinopril (ZESTRIL) 10 MG tablet Take 1 tablet (10 mg total) by mouth daily. 01/01/20   Georgina Quint, MD  rosuvastatin (CRESTOR) 20 MG tablet Take 1 tablet (20 mg total) by mouth daily. 01/01/20   Georgina Quint, MD    Family History History reviewed. No pertinent family history.  Social History Social History   Tobacco Use  . Smoking status: Never Smoker  . Smokeless tobacco: Never Used  Vaping Use  . Vaping Use: Never used  Substance Use Topics  . Alcohol use: No  . Drug use: Not Currently     Allergies   Patient has no known allergies.   Review of Systems Review of Systems Per HPI Physical Exam Triage  Vital Signs ED Triage Vitals  Enc Vitals Group     BP 05/07/20 1435 139/73     Pulse Rate 05/07/20 1435 88     Resp 05/07/20 1435 16     Temp 05/07/20 1435 97.9 F (36.6 C)     Temp Source 05/07/20 1435 Oral     SpO2 05/07/20 1435 99 %     Weight --      Height --      Head Circumference --      Peak Flow --      Pain Score 05/07/20 1433 6     Pain Loc --      Pain Edu? --      Excl. in GC? --    No data found.  Updated Vital Signs BP 139/73 (BP Location: Right Arm)   Pulse 88   Temp 97.9 F (36.6 C) (Oral)   Resp 16   SpO2 99%   Visual Acuity Right Eye Distance:   Left Eye Distance:   Bilateral Distance:    Right Eye Near:   Left Eye Near:    Bilateral Near:     Physical Exam Vitals and nursing note reviewed.  Constitutional:      Appearance: Normal appearance.  HENT:     Head: Atraumatic.  Eyes:     Extraocular Movements: Extraocular movements intact.     Conjunctiva/sclera: Conjunctivae normal.  Cardiovascular:  Rate and Rhythm: Normal rate and regular rhythm.  Pulmonary:     Effort: Pulmonary effort is normal.     Breath sounds: Normal breath sounds.  Musculoskeletal:        General: No swelling, tenderness, deformity or signs of injury. Normal range of motion.     Cervical back: Normal range of motion and neck supple.     Comments: Good strength and range of motion left upper extremity  Skin:    General: Skin is warm and dry.  Neurological:     General: No focal deficit present.     Mental Status: He is oriented to person, place, and time.  Psychiatric:        Mood and Affect: Mood normal.        Thought Content: Thought content normal.        Judgment: Judgment normal.      UC Treatments / Results  Labs (all labs ordered are listed, but only abnormal results are displayed) Labs Reviewed - No data to display  EKG   Radiology DG Shoulder Left  Result Date: 05/07/2020 CLINICAL DATA:  Pain over the last 2 weeks. EXAM: LEFT SHOULDER  - 2+ VIEW COMPARISON:  11/07/2016 FINDINGS: Humeral head is properly located. No evidence of fracture or focal lesion. Lateral sloping and pointed acromion could predispose to rotator cuff disease. Mild degenerative arthritis of the [facility] joint. IMPRESSION: No acute or traumatic finding. Lateral sloping and pointed acromion could predispose to rotator cuff disease. Mild AC joint osteoarthritis. Electronically Signed   By: Paulina Fusi M.D.   On: 05/07/2020 15:13    Procedures Procedures (including critical care time)  Medications Ordered in UC Medications  triamcinolone acetonide (KENALOG-40) injection 40 mg (40 mg Intramuscular Given 05/07/20 1605)    Initial Impression / Assessment and Plan / UC Course  I have reviewed the triage vital signs and the nursing notes.  Pertinent labs & imaging results that were available during my care of the patient were reviewed by me and considered in my medical decision making (see chart for details).     X-ray left shoulder showing degenerative changes but otherwise no acute abnormality.  Will start naproxen as needed, IM Kenalog today per his request, follow-up with orthopedics if recurrent or ongoing.  Final Clinical Impressions(s) / UC Diagnoses   Final diagnoses:  Acute pain of left shoulder   Discharge Instructions   None    ED Prescriptions    Medication Sig Dispense Auth. Provider   naproxen (NAPROSYN) 500 MG tablet Take 1 tablet (500 mg total) by mouth 2 (two) times daily. 30 tablet Particia Nearing, [place]     PDMP not reviewed this encounter.   Particia Nearing, [place] 05/07/20 1628

## 2020-05-07 NOTE — ED Triage Notes (Signed)
Pt presents with left shoulder pain X 2 weeks. Pt daughter denies pt injuring himself.

## 2020-05-23 ENCOUNTER — Other Ambulatory Visit: Payer: Self-pay | Admitting: Family Medicine

## 2020-07-01 ENCOUNTER — Ambulatory Visit: Payer: Medicare HMO | Admitting: Emergency Medicine

## 2020-07-01 ENCOUNTER — Ambulatory Visit: Payer: Self-pay | Admitting: Emergency Medicine

## 2020-08-18 ENCOUNTER — Ambulatory Visit (INDEPENDENT_AMBULATORY_CARE_PROVIDER_SITE_OTHER): Payer: Medicare HMO | Admitting: Emergency Medicine

## 2020-08-18 ENCOUNTER — Encounter: Payer: Self-pay | Admitting: Emergency Medicine

## 2020-08-18 ENCOUNTER — Other Ambulatory Visit: Payer: Self-pay

## 2020-08-18 VITALS — BP 130/70 | HR 76 | Temp 98.1°F | Ht 62.0 in | Wt 125.0 lb

## 2020-08-18 DIAGNOSIS — E785 Hyperlipidemia, unspecified: Secondary | ICD-10-CM

## 2020-08-18 DIAGNOSIS — I10 Essential (primary) hypertension: Secondary | ICD-10-CM

## 2020-08-18 DIAGNOSIS — R7303 Prediabetes: Secondary | ICD-10-CM

## 2020-08-18 LAB — HEMOGLOBIN A1C: Hgb A1c MFr Bld: 6.2 % (ref 4.6–6.5)

## 2020-08-18 LAB — COMPREHENSIVE METABOLIC PANEL
ALT: 24 U/L (ref 0–53)
AST: 31 U/L (ref 0–37)
Albumin: 4.1 g/dL (ref 3.5–5.2)
Alkaline Phosphatase: 75 U/L (ref 39–117)
BUN: 16 mg/dL (ref 6–23)
CO2: 26 mEq/L (ref 19–32)
Calcium: 9.1 mg/dL (ref 8.4–10.5)
Chloride: 107 mEq/L (ref 96–112)
Creatinine, Ser: 1.09 mg/dL (ref 0.40–1.50)
GFR: 64.7 mL/min (ref >60.00)
Glucose, Bld: 105 mg/dL — ABNORMAL HIGH (ref 70–99)
Potassium: 4.1 mEq/L (ref 3.5–5.1)
Sodium: 140 mEq/L (ref 135–145)
Total Bilirubin: 0.6 mg/dL (ref 0.2–1.2)
Total Protein: 7.1 g/dL (ref 6.0–8.3)

## 2020-08-18 LAB — LIPID PANEL
Cholesterol: 253 mg/dL — ABNORMAL HIGH (ref 0–200)
HDL: 37.2 mg/dL — ABNORMAL LOW (ref >39.00)
Total CHOL/HDL Ratio: 7
Triglycerides: 525 mg/dL — ABNORMAL HIGH (ref 0.0–149.0)

## 2020-08-18 LAB — LDL CHOLESTEROL, DIRECT: Direct LDL: 128 mg/dL

## 2020-08-18 MED ORDER — LISINOPRIL 10 MG PO TABS: 10.0000 mg | ORAL_TABLET | Freq: Every day | ORAL | 3 refills | Status: DC

## 2020-08-18 MED ORDER — ROSUVASTATIN CALCIUM 20 MG PO TABS: 20.0000 mg | ORAL_TABLET | Freq: Every day | ORAL | 3 refills | Status: DC

## 2020-08-18 NOTE — Assessment & Plan Note (Signed)
Diet and nutrition discussed.  Hemoglobin A1c done today. 

## 2020-08-18 NOTE — Assessment & Plan Note (Signed)
Diet and nutrition discussed.  Continue rosuvastatin 20 mg daily.  Lipid fasting profile done today.

## 2020-08-18 NOTE — Progress Notes (Signed)
Kenneth Moore 80 y.o.   Chief Complaint  Patient presents with   Hypertension    Follow up,medication refill lisinopril and crsetor    HISTORY OF PRESENT ILLNESS: This is a 80 y.o. male with history of hypertension, prediabetes and dyslipidemia here for follow-up and medication refill. 1.  Hypertension: On lisinopril 10 mg daily 2.  Prediabetes 3.  Dyslipidemia and hypertriglyceridemia on rosuvastatin 20 mg daily. Daughter helping as Nurse, learning disability. Doing well.  Has no complaints or medical concerns today.  Hypertension Pertinent negatives include no chest pain, headaches, neck pain, palpitations or shortness of breath.    Prior to Admission medications   Medication Sig Start Date End Date Taking? Authorizing Provider  rosuvastatin (CRESTOR) 20 MG tablet Take 1 tablet (20 mg total) by mouth daily. 01/01/20  Yes Ky Moskowitz, Eilleen Kempf, MD  lisinopril (ZESTRIL) 10 MG tablet Take 1 tablet (10 mg total) by mouth daily. 01/01/20   Georgina Quint, MD  naproxen (NAPROSYN) 500 MG tablet Take 1 tablet (500 mg total) by mouth 2 (two) times daily. Patient not taking: Reported on 08/18/2020 05/07/20   Particia Nearing, PA-C    No Known Allergies  Patient Active Problem List   Diagnosis Date Noted   Dyslipidemia 01/02/2019   Prediabetes 01/02/2019   Essential hypertension 06/24/2018   Arthritis, shoulder region 11/07/2016    Past Medical History:  Diagnosis Date   Hypertension     No past surgical history on file.  Social History   Socioeconomic History   Marital status: Married    Spouse name: Not on file   Number of children: 3   Years of education: Not on file   Highest education level: Not on file  Occupational History   Not on file  Tobacco Use   Smoking status: Never   Smokeless tobacco: Never  Vaping Use   Vaping Use: Never used  Substance and Sexual Activity   Alcohol use: No   Drug use: Not Currently   Sexual activity: Not Currently  Other Topics  Concern   Not on file  Social History Narrative   Not on file   Social Determinants of Health   Financial Resource Strain: Not on file  Food Insecurity: Not on file  Transportation Needs: Not on file  Physical Activity: Not on file  Stress: Not on file  Social Connections: Not on file  Intimate Partner Violence: Not on file    No family history on file.   Review of Systems  Constitutional: Negative.  Negative for chills and fever.  HENT: Negative.  Negative for congestion and sore throat.   Respiratory: Negative.  Negative for cough and shortness of breath.   Cardiovascular: Negative.  Negative for chest pain and palpitations.  Gastrointestinal: Negative.  Negative for abdominal pain, diarrhea, nausea and vomiting.  Genitourinary: Negative.  Negative for dysuria and hematuria.  Musculoskeletal: Negative.  Negative for back pain, myalgias and neck pain.  Skin: Negative.  Negative for rash.  Neurological: Negative.  Negative for dizziness and headaches.  All other systems reviewed and are negative.  Vitals:   08/18/20 1040  BP: 130/70  Pulse: 76  Temp: 98.1 F (36.7 C)  SpO2: 97%    Body mass index is 22.86 kg/m. Wt Readings from Last 3 Encounters:  08/18/20 125 lb (56.7 kg)  01/01/20 129 lb (58.5 kg)  07/03/19 132 lb (59.9 kg)    Physical Exam Vitals reviewed.  Constitutional:      Appearance: Normal appearance.  HENT:  Head: Normocephalic.  Eyes:     Extraocular Movements: Extraocular movements intact.     Conjunctiva/sclera: Conjunctivae normal.     Pupils: Pupils are equal, round, and reactive to light.  Cardiovascular:     Rate and Rhythm: Normal rate and regular rhythm.     Pulses: Normal pulses.     Heart sounds: Normal heart sounds.  Pulmonary:     Effort: Pulmonary effort is normal.     Breath sounds: Normal breath sounds.  Abdominal:     General: Bowel sounds are normal.     Palpations: Abdomen is soft.     Tenderness: There is no  abdominal tenderness.  Musculoskeletal:     Cervical back: Normal range of motion and neck supple.     Right lower leg: No edema.     Left lower leg: No edema.  Skin:    General: Skin is warm and dry.     Capillary Refill: Capillary refill takes less than 2 seconds.  Neurological:     General: No focal deficit present.     Mental Status: He is alert and oriented to person, place, and time.  Psychiatric:        Mood and Affect: Mood normal.        Behavior: Behavior normal.     ASSESSMENT & PLAN: A total of 30 minutes was spent with the patient and counseling/coordination of care regarding preparing for this visit, review of most recent office visit notes, review of most recent blood work results, review of all medications, multiple chronic medical problems under management, education and nutrition, health maintenance items including fall precautions, prognosis, documentation and need for follow-up.  Essential hypertension Well-controlled hypertension.  Continue lisinopril 10 mg daily.  Prediabetes Diet and nutrition discussed.  Hemoglobin A1c done today.  Dyslipidemia Diet and nutrition discussed.  Continue rosuvastatin 20 mg daily.  Lipid fasting profile done today.  Arlo was seen today for hypertension.  Diagnoses and all orders for this visit:  Essential hypertension -     Comprehensive metabolic panel -     lisinopril (ZESTRIL) 10 MG tablet; Take 1 tablet (10 mg total) by mouth daily.  Dyslipidemia -     Lipid panel -     rosuvastatin (CRESTOR) 20 MG tablet; Take 1 tablet (20 mg total) by mouth daily.  Prediabetes -     Hemoglobin A1c  Patient Instructions  T?ng huy?t p, Ng??i l?n Hypertension, Adult Huy?t p cao (t?ng huy?t p) l khi l?c b?m mu qua ??ng m?ch qu m?nh. ??ng m?ch l cc m?ch mu mang mu t? tim ?i kh?p c? th?. T?ng huy?t p khi?n tim lm vi?c v?t v? h?n ?? b?m mu v c th? khi?n cc ??ng m?ch tr? ln h?p ho?c c?ng. T?ng huy?t p khng ???c ?i?u  tr? ho?c ki?m sot c th? d?n t?i nh?i mu c? tim, suy tim, ??t qu?, b?nhth?n v nh?ng v?n ?? khc. Ch? s? ?o huy?t p g?m m?t ch? s? cao trn m?t ch? s? th?p. L t??ng l huy?t a?p cu?a quy? vi? c?n ph?i d??i 120/80. Ch? s? ??u tin ("??nh") ???c g?i l huy?t p tm thu. ?y l s? ?o p su?t trong ??ng m?ch khi tim qu v? ??p. Ch? s? th? hai ("?y") ???c g?i l huy?t p tm tr??ng. ?y l s? ?o p su?t trong??ng m?ch khi tim qu v? ngh?[place] nhn g gy ra? Khng r nguyn nhn chnh xc gy ra tnh tr?ng ny. C m?t s?  tnh tr?ng d?n??n ho?c c lin quan ??n huy?t p cao. ?i?u g lm t?ng nguy c?? M?t s? y?u t? nguy c? d?n ??n huy?t p cao c th? ki?m sot ???c. Nh?ng y?u t? sau c th? lm qu v? d? b? tnh tr?ng ny h?n: Ht thu?c. B? b?nh ti?u ???ng tup 2, cholesterol cao, ho?c c? hai. Khng t?p th? d?c ho?c cc ho?t ??ng th? ch?t ??y ??Marland Kitchen Th?a cn. ?n qu nhi?u ch?t bo, ???ng, ca-lo, ho?c mu?i (N[place]). U?ng qu nhi?u r??u. M?t s? y?u t? nguy c? b? huy?t p cao c th? kh ho?c khng th? thay ??i. M?t s? y?u t? trong s? ny bao g?m: B?nh th?n m?n tnh. C ti?n s? gia ?nh b? cao huy?t p. ?? tu?i. Nguy c? t?ng ln theo ?? tu?i. Ch?ng t?c. Qu v? c nguy c? cao h?n n?u qu v? l ng??i M? g?c Phi. Gi?i tnh. Nam gi?i c nguy c? cao h?n ph? n? tr??c tu?i 45. Sau tu?i 66, ph? n? c nguy c? cao h?n nam gi?i. Ng?ng th? khi ng? do t?c ngh?n. C?ng th?ng. Cc d?u hi?u ho?c tri?u ch?ng g? Huy?t p cao c th? khng gy ra cc tri?u ch?ng. Huy?t p r?t cao (c?n t?ng huy?t p) c th? gy ra: ?au ??u. Lo u. Kh th?. Ch?y mu cam. Bu?n nn v nn m?a. Th? l?c thay ??i. ?au ng?c d? d?i. Co gi?t. Ch?n ?on tnh tr?ng ny nh? th? no? Tnh tr?ng ny ???c ch?n ?on b?ng cch ?o huy?t p c?a qu v? lc qu v? ng?i, ?? tay trn m?t b? m?t ph?ng, hai chn khng b?t cho, v hai bn chn b?ng ph?ng trn sn. B?ng qu?n thi?t b? ?o huy?t p s? ???c qu?n tr?c ti?p vo vng da cnh tay pha trn c?a  qu v? ngang v?i m?c tim. Huy?t p c?n ???c ?o t nh?t hai l?n trn cng m?t cnh tay. M?t s? tnh tr?ng nh?t ??nh c th? lm chohuy?t p khc nhau gi?a tay ph?i v tay tri c?a qu v?. M?t s? y?u t? nh?t ??nh c th? khi?n ch? s? ?o huy?t p th?p h?n ho?c cao h?n so v?i bnh th??ng trong th?i gian ng?n: Khi huy?t p c?a qu v? ? phng khm c?a chuyn gia ch?m [place] s?c kh?e cao h?n so v?i lc qu v? ? nh, hi?n t??ng ny ???c g?i l t?ng huy?t p o chong tr?ng. H?u h?t nh?ng ng??i b? tnh tr?ng ny ??u khng c?n dng thu?c. Khi huy?t p c?a qu v? lc ? nh cao h?n so v?i lc qu v? ? phng khm chuyn gia ch?m [place] s?c kh?e, hi?n t??ng ny ???c g?i l t?ng huy?t p ?n. H?u h?t nh?ng ng??i b? tnh tr?ng ny ??u c th? c?n dng thu?c ?? ki?m sot huy?t p. N?u qu v? c ch? s? huy?t p cao trong m?t l?n khm ho?c qu v? c huy?t p bnh th??ng c km cc y?u t? nguy c? khc, qu v? c th? ???c yu c?u: Tr? l?i vo m?t ngy khc ?? ki?m tra l?i huy?t p. Theo di huy?t p t?i nh trong vng 1 tu?n ho?c lu h?n. N?u qu v? ???c ch?n ?on b? t?ng huy?t p, qu v? c th? c?n th?c hi?n cc xt nghi?m mu ho?c ki?m tra hnh ?nh khc ?? gip chuyn gia ch?m  s?c kh?ehi?u nguy c? t?ng th? m?c cc b?nh tr?ng khc. Tnh tr?ng ny ???c ?i?u tr? nh? th? no? Tnh tr?ng  ny ???c ?i?u tr? b?ng cch thay ??i l?i s?ng lnh m?nh, ch?ng h?nh nh? ?n th?c ph?m c l?i cho s?c kh?e, t?p th? d?c nhi?u h?n v gi?m l??ng r??u u?ng vo. N?u thay ??i l?i s?ng khng ?? ?? ??a huy?t p v? m?c c th? ki?m sot ???c, chuyn gia ch?m [place] s?c kh?e c th? k ??n thu?c, v n?u: Huy?t p tm thu c?a qu v? trn 130. Huy?t p tm tr??ng c?a qu v? trn 80. Huy?t p m?c tiu c nhn c?a qu v? c th? khc nhau ty thu?c v tnh tr?ngb?nh l, tu?i v cc nhn t? khc. Tun th? nh?ng h??ng d?n ny ? nh: ?n v u?ng  ?n ch? ?? giu ch?t x? v kali v t natri, ???ng ph? gia v ch?t bo. M?t k? ho?ch ?n m?u c tn l ch? ?? ?n DASH (Cch  ti?p c?n ch? ?? ?n u?ng ?? ng?n ch?n t?ng huy?t p). ?n theo cch ny: ?n nhi?u tri cy v rau t??i. Vo m?i b?a ?n, c? g?ng dnh m?t n?a ??a cho tri cy v rau c?. ?n ng? c?c nguyn cm, ch?ng h?n nh? m ?ng lm t? b?t m nguyn cm, g?o l?t, ho?c bnh m t? b?t m nguyn cm. Cho ngu? c?c nguyn ca?m va?o kho?ng m?t ph?n t? ??a. ?n ho?c hu?ng cc s?n ph?m t? s?a t bo, ch?ng h?n nh? s?a ? b? kem ho?c s?a chua t bo. Trnh nh?ng mi?ng th?t nhi?u m?, th?t ? qua ch? bi?n ho?c th?t ??p mu?i v th?t gia c?m c da. Dnh kho?ng m?t ph?n t? ??a c?a qu v? cho cc protein khng m?, ch?ng h?n nh? c, th?t g khng da, ??u, tr?ng, ho?c ??u ph?. Trnh nh?ng th?c ph?m ch? bi?n ho?c lm s?n. Nh?ng th?c ph?m ny th??ng c nhi?u natri, ???ng ph? gia v ch?t bo h?n. Gi?m l??ng dng natri hng ngy c?a qu v?. H?u h?t nh?ng ng??i b? t?ng huy?t p ??u nn ?n d??i 1.500 mg natri m?i ngy. Khng u?ng r??u n?u: Chuyn gia ch?m [place] s?c kh?e c?a qu v? khuyn qu v? khng u?ng r??u. Qu v? c [place], c th? c [place], ho?c c k? ho?ch c [place]. N?u qu v? u?ng r??u: Gi?i h?n l??ng r??u qu v? u?ng ? m?c: 0-1 ly/ngy ??i v?i n? gi?i. 0-2 ly/ngy ??i v?i nam gi?i. Bi?t r m?t ly c bao nhiu r??u. ? M?, m?t ly t??ng ???ng v?i m?t chai bia 12 ao x? (355 mL), m?t ly r??u vang 5 ao x? (148 mL), ho?c m?t ly r??u m?nh 1 ao x? (44 mL).  L?i s?ng  H?p tc v?i chuyn gia ch?m [place] s?c kh?e c?a qu v? ?? duy tr tr?ng l??ng c? th? c l?i cho s?c kh?e ho?c ?? gi?m cn. Hy h?i xem tr?ng l??ng no l l t??ng cho qu v?. T?p th? d?c t nh?t 30 pht h?u h?t cc ngy trong tu?n. Cc ho?t ??ng c th? bao g?m ?i b?, b?i, ho?c ??p xe. Bao g?m bi t?p t?ng c??ng c? (bi t?p khng l?c), ch?ng h?n nh? bi t?p [place] ho?c nng t?, nh? m?t ph?n c?a thi quen luy?n t?p hng tu?n c?a qu v?. C? g?ng t?p nh?ng lo?i bi t?p ny trong vng 30 pht t?i thi?u 3 ngy m?i tu?n. Khng s? d?ng b?t k? s?n ph?m no c nicotine ho?c thu?c l, ch?ng  ha?n nh? thu?c l d?ng ht, thu?c l ?i?n t? v thu?c l d?ng nhai. N?u  qu v? c?n gip ?? ?? cai thu?c, hy h?i chuyn gia ch?m [place] s?c kh?e. Theo di huy?t p c?a qu v? t?i nh theo h??ng d?n c?a chuyn gia ch?m [place] s?c kh?e. Tun th? t?t c? cc l?n khm theo di theo ch? d?n c?a chuyn gia ch?m [place] s?c kh?e. ?i?u ny c vai tr quan tr?ng.  Thu?c Ch? s? d?ng thu?c khng k ??n v thu?c k ??n theo ch? d?n c?a chuyn gia ch?m [place] s?c kh?e. Lm theo ch? d?n m?t cch c?n th?n. Thu?c ?i?u tr? huy?t p ph?i ???c dng theo ??n ? k. Khng b? cc li?u thu?c huy?t p. Vi?c b? dng thu?c khi?n qu v? c nguy c? g?p ph?i cc v?n ?? v c th? lm cho thu?c gi?m hi?u qu?Marland Kitchen Hy h?i chuyn gia ch?m [place] s?c kh?e c?a qu v? v? nh?ng tc d?ng ph? ho?c ph?n ?ng v?i thu?c m qu v? ph?i theo di. Hy lin l?c v?i chuyn gia ch?m [place] s?c kh?e n?u qu v?: Ngh? qu v? c ph?n ?ng v?i thu?c ?ang dng. B? ?au ??u ti?p t?c tr? l?i (ti pht). C?m th?y chng m?t. B? s?ng ph ? c? chn. C v?n ?? v? th? l?c. Yu c?u tr? gip ngay l?p t?c n?u qu v?: B? ?au ??u r?t nhi?u ho?c l l?n. B? y?u ho?c t b b?t th??ng. C?m th?y ng?t x?u. B? ?au r?t nhi?u ? ng?c ho?c b?ng. Nn nhi?u l?n. B? kh th?. Tm t?t T?ng huy?t p l khi l?c b?m mu qua cc ??ng m?ch c?a qu v? qu m?nh. N?u tnh tr?ng ny khng ???c ki?m sot, n c th? khi?n qu v? c nguy c? v? cc bi?n ch?ng nghim tr?ng. Huy?t p m?c tiu c nhn c?a qu v? c th? khc nhau ty thu?c v tnh tr?ng b?nh l, tu?i v cc nhn t? khc. ??i v?i h?u h?t m?i ng??i, huy?t p bnh th??ng l d??i 120/80. ?i?u tr? t?ng huy?t p b?ng cch thay ??i l?i s?ng, dng thu?c, ho?c k?t h?p c? hai. Thay ??i l?i s?ng bao g?m gi?m cn, ?n ch? ?? ?n c l?i cho s?c kh?e, t natri, t?p th? d?c nhi?u h?n v h?n ch? u?ng r??u. Thng tin ny khng nh?m m?c ?ch thay th? cho l?i khuyn m chuyn gia ch?m [place] s?c kh?e ni v?i qu v?. Hy b?o ??m qu v? ph?i th?o lu?n b?t k? v?n ?? gm qu  v? c v?i chuyn gia ch?m [place] s?c kh?e c?a qu v?. Document Revised: 11/09/2017 Document Reviewed: 11/09/2017 Elsevier Patient Education  2022 [employer]   Edwina Barth, MD [facility]

## 2020-08-18 NOTE — Assessment & Plan Note (Signed)
Well-controlled hypertension. Continue lisinopril 10 mg daily 

## 2020-08-18 NOTE — Patient Instructions (Signed)
T?ng huy?t p, Ng??i l?n Hypertension, Adult Huy?t p cao (t?ng huy?t p) l khi l?c b?m mu qua ??ng m?ch qu m?nh. ??ng m?ch l cc m?ch mu mang mu t? tim ?i kh?p c? th?. T?ng huy?t p khi?n tim lm vi?c v?t v? h?n ?? b?m mu v c th? khi?n cc ??ng m?ch tr? ln h?p ho?c c?ng. T?ng huy?t p khng ???c ?i?u tr? ho?c ki?m sot c th? d?n t?i nh?i mu c? tim, suy tim, ??t qu?, b?nhth?n v nh?ng v?n ?? khc. Ch? s? ?o huy?t p g?m m?t ch? s? cao trn m?t ch? s? th?p. L t??ng l huy?t a?p cu?a quy? vi? c?n ph?i d??i 120/80. Ch? s? ??u tin ("??nh") ???c g?i l huy?t p tm thu. ?y l s? ?o p su?t trong ??ng m?ch khi tim qu v? ??p. Ch? s? th? hai ("?y") ???c g?i l huy?t p tm tr??ng. ?y l s? ?o p su?t trong??ng m?ch khi tim qu v? ngh?. Nguyn nhn g gy ra? Khng r nguyn nhn chnh xc gy ra tnh tr?ng ny. C m?t s? tnh tr?ng d?n??n ho?c c lin quan ??n huy?t p cao. ?i?u g lm t?ng nguy c?? M?t s? y?u t? nguy c? d?n ??n huy?t p cao c th? ki?m sot ???c. Nh?ng y?u t? sau c th? lm qu v? d? b? tnh tr?ng ny h?n: Ht thu?c. B? b?nh ti?u ???ng tup 2, cholesterol cao, ho?c c? hai. Khng t?p th? d?c ho?c cc ho?t ??ng th? ch?t ??y ??. Th?a cn. ?n qu nhi?u ch?t bo, ???ng, ca-lo, ho?c mu?i (Natri). U?ng qu nhi?u r??u. M?t s? y?u t? nguy c? b? huy?t p cao c th? kh ho?c khng th? thay ??i. M?t s? y?u t? trong s? ny bao g?m: B?nh th?n m?n tnh. C ti?n s? gia ?nh b? cao huy?t p. ?? tu?i. Nguy c? t?ng ln theo ?? tu?i. Ch?ng t?c. Qu v? c nguy c? cao h?n n?u qu v? l ng??i M? g?c Phi. Gi?i tnh. Nam gi?i c nguy c? cao h?n ph? n? tr??c tu?i 45. Sau tu?i 66, ph? n? c nguy c? cao h?n nam gi?i. Ng?ng th? khi ng? do t?c ngh?n. C?ng th?ng. Cc d?u hi?u ho?c tri?u ch?ng g? Huy?t p cao c th? khng gy ra cc tri?u ch?ng. Huy?t p r?t cao (c?n t?ng huy?t p) c th? gy ra: ?au ??u. Lo u. Kh th?. Ch?y mu cam. Bu?n nn v nn m?a. Th? l?c thay ??i. ?au ng?c d?  d?i. Co gi?t. Ch?n ?on tnh tr?ng ny nh? th? no? Tnh tr?ng ny ???c ch?n ?on b?ng cch ?o huy?t p c?a qu v? lc qu v? ng?i, ?? tay trn m?t b? m?t ph?ng, hai chn khng b?t cho, v hai bn chn b?ng ph?ng trn sn. B?ng qu?n thi?t b? ?o huy?t p s? ???c qu?n tr?c ti?p vo vng da cnh tay pha trn c?a qu v? ngang v?i m?c tim. Huy?t p c?n ???c ?o t nh?t hai l?n trn cng m?t cnh tay. M?t s? tnh tr?ng nh?t ??nh c th? lm chohuy?t p khc nhau gi?a tay ph?i v tay tri c?a qu v?. M?t s? y?u t? nh?t ??nh c th? khi?n ch? s? ?o huy?t p th?p h?n ho?c cao h?n so v?i bnh th??ng trong th?i gian ng?n: Khi huy?t p c?a qu v? ? phng khm c?a chuyn gia ch?m sc s?c kh?e cao h?n so v?i lc qu v? ? nh, hi?n t??ng ny ???c g?i l t?ng huy?t   p o chong tr?ng. H?u h?t nh?ng ng??i b? tnh tr?ng ny ??u khng c?n dng thu?c. Khi huy?t p c?a qu v? lc ? nh cao h?n so v?i lc qu v? ? phng khm chuyn gia ch?m sc s?c kh?e, hi?n t??ng ny ???c g?i l t?ng huy?t p ?n. H?u h?t nh?ng ng??i b? tnh tr?ng ny ??u c th? c?n dng thu?c ?? ki?m sot huy?t p. N?u qu v? c ch? s? huy?t p cao trong m?t l?n khm ho?c qu v? c huy?t p bnh th??ng c km cc y?u t? nguy c? khc, qu v? c th? ???c yu c?u: Tr? l?i vo m?t ngy khc ?? ki?m tra l?i huy?t p. Theo di huy?t p t?i nh trong vng 1 tu?n ho?c lu h?n. N?u qu v? ???c ch?n ?on b? t?ng huy?t p, qu v? c th? c?n th?c hi?n cc xt nghi?m mu ho?c ki?m tra hnh ?nh khc ?? gip chuyn gia ch?m sc s?c kh?ehi?u nguy c? t?ng th? m?c cc b?nh tr?ng khc. Tnh tr?ng ny ???c ?i?u tr? nh? th? no? Tnh tr?ng ny ???c ?i?u tr? b?ng cch thay ??i l?i s?ng lnh m?nh, ch?ng h?nh nh? ?n th?c ph?m c l?i cho s?c kh?e, t?p th? d?c nhi?u h?n v gi?m l??ng r??u u?ng vo. N?u thay ??i l?i s?ng khng ?? ?? ??a huy?t p v? m?c c th? ki?m sot ???c, chuyn gia ch?m sc s?c kh?e c th? k ??n thu?c, v n?u: Huy?t p tm thu c?a qu v? trn 130. Huy?t p tm  tr??ng c?a qu v? trn 80. Huy?t p m?c tiu c nhn c?a qu v? c th? khc nhau ty thu?c v tnh tr?ngb?nh l, tu?i v cc nhn t? khc. Tun th? nh?ng h??ng d?n ny ? nh: ?n v u?ng  ?n ch? ?? giu ch?t x? v kali v t natri, ???ng ph? gia v ch?t bo. M?t k? ho?ch ?n m?u c tn l ch? ?? ?n DASH (Cch ti?p c?n ch? ?? ?n u?ng ?? ng?n ch?n t?ng huy?t p). ?n theo cch ny: ?n nhi?u tri cy v rau t??i. Vo m?i b?a ?n, c? g?ng dnh m?t n?a ??a cho tri cy v rau c?. ?n ng? c?c nguyn cm, ch?ng h?n nh? m ?ng lm t? b?t m nguyn cm, g?o l?t, ho?c bnh m t? b?t m nguyn cm. Cho ngu? c?c nguyn ca?m va?o kho?ng m?t ph?n t? ??a. ?n ho?c hu?ng cc s?n ph?m t? s?a t bo, ch?ng h?n nh? s?a ? b? kem ho?c s?a chua t bo. Trnh nh?ng mi?ng th?t nhi?u m?, th?t ? qua ch? bi?n ho?c th?t ??p mu?i v th?t gia c?m c da. Dnh kho?ng m?t ph?n t? ??a c?a qu v? cho cc protein khng m?, ch?ng h?n nh? c, th?t g khng da, ??u, tr?ng, ho?c ??u ph?. Trnh nh?ng th?c ph?m ch? bi?n ho?c lm s?n. Nh?ng th?c ph?m ny th??ng c nhi?u natri, ???ng ph? gia v ch?t bo h?n. Gi?m l??ng dng natri hng ngy c?a qu v?. H?u h?t nh?ng ng??i b? t?ng huy?t p ??u nn ?n d??i 1.500 mg natri m?i ngy. Khng u?ng r??u n?u: Chuyn gia ch?m sc s?c kh?e c?a qu v? khuyn qu v? khng u?ng r??u. Qu v? c thai, c th? c thai, ho?c c k? ho?ch c thai. N?u qu v? u?ng r??u: Gi?i h?n l??ng r??u qu v? u?ng ? m?c: 0-1 ly/ngy ??i v?i n? gi?i. 0-2 ly/ngy ??i v?i nam gi?i. Bi?t r m?t ly c bao nhiu   r??u. ? M?, m?t ly t??ng ???ng v?i m?t chai bia 12 ao x? (355 mL), m?t ly r??u vang 5 ao x? (148 mL), ho?c m?t ly r??u m?nh 1 ao x? (44 mL).  L?i s?ng  H?p tc v?i chuyn gia ch?m sc s?c kh?e c?a qu v? ?? duy tr tr?ng l??ng c? th? c l?i cho s?c kh?e ho?c ?? gi?m cn. Hy h?i xem tr?ng l??ng no l l t??ng cho qu v?. T?p th? d?c t nh?t 30 pht h?u h?t cc ngy trong tu?n. Cc ho?t ??ng c th? bao g?m ?i b?, b?i,  ho?c ??p xe. Bao g?m bi t?p t?ng c??ng c? (bi t?p khng l?c), ch?ng h?n nh? bi t?p Pilates ho?c nng t?, nh? m?t ph?n c?a thi quen luy?n t?p hng tu?n c?a qu v?. C? g?ng t?p nh?ng lo?i bi t?p ny trong vng 30 pht t?i thi?u 3 ngy m?i tu?n. Khng s? d?ng b?t k? s?n ph?m no c nicotine ho?c thu?c l, ch?ng ha?n nh? thu?c l d?ng ht, thu?c l ?i?n t? v thu?c l d?ng nhai. N?u qu v? c?n gip ?? ?? cai thu?c, hy h?i chuyn gia ch?m sc s?c kh?e. Theo di huy?t p c?a qu v? t?i nh theo h??ng d?n c?a chuyn gia ch?m sc s?c kh?e. Tun th? t?t c? cc l?n khm theo di theo ch? d?n c?a chuyn gia ch?m sc s?c kh?e. ?i?u ny c vai tr quan tr?ng.  Thu?c Ch? s? d?ng thu?c khng k ??n v thu?c k ??n theo ch? d?n c?a chuyn gia ch?m sc s?c kh?e. Lm theo ch? d?n m?t cch c?n th?n. Thu?c ?i?u tr? huy?t p ph?i ???c dng theo ??n ? k. Khng b? cc li?u thu?c huy?t p. Vi?c b? dng thu?c khi?n qu v? c nguy c? g?p ph?i cc v?n ?? v c th? lm cho thu?c gi?m hi?u qu?. Hy h?i chuyn gia ch?m sc s?c kh?e c?a qu v? v? nh?ng tc d?ng ph? ho?c ph?n ?ng v?i thu?c m qu v? ph?i theo di. Hy lin l?c v?i chuyn gia ch?m sc s?c kh?e n?u qu v?: Ngh? qu v? c ph?n ?ng v?i thu?c ?ang dng. B? ?au ??u ti?p t?c tr? l?i (ti pht). C?m th?y chng m?t. B? s?ng ph ? c? chn. C v?n ?? v? th? l?c. Yu c?u tr? gip ngay l?p t?c n?u qu v?: B? ?au ??u r?t nhi?u ho?c l l?n. B? y?u ho?c t b b?t th??ng. C?m th?y ng?t x?u. B? ?au r?t nhi?u ? ng?c ho?c b?ng. Nn nhi?u l?n. B? kh th?. Tm t?t T?ng huy?t p l khi l?c b?m mu qua cc ??ng m?ch c?a qu v? qu m?nh. N?u tnh tr?ng ny khng ???c ki?m sot, n c th? khi?n qu v? c nguy c? v? cc bi?n ch?ng nghim tr?ng. Huy?t p m?c tiu c nhn c?a qu v? c th? khc nhau ty thu?c v tnh tr?ng b?nh l, tu?i v cc nhn t? khc. ??i v?i h?u h?t m?i ng??i, huy?t p bnh th??ng l d??i 120/80. ?i?u tr? t?ng huy?t p b?ng cch thay ??i l?i s?ng, dng  thu?c, ho?c k?t h?p c? hai. Thay ??i l?i s?ng bao g?m gi?m cn, ?n ch? ?? ?n c l?i cho s?c kh?e, t natri, t?p th? d?c nhi?u h?n v h?n ch? u?ng r??u. Thng tin ny khng nh?m m?c ?ch thay th? cho l?i khuyn m chuyn gia ch?m sc s?c kh?e ni v?i qu v?. Hy b?o ??m qu v? ph?i th?o lu?n   b?t k? v?n ?? gm qu v? c v?i chuyn gia ch?m sc s?c kh?e c?a qu v?. Document Revised: 11/09/2017 Document Reviewed: 11/09/2017 Elsevier Patient Education  2022 Elsevier Inc.  

## 2020-08-19 ENCOUNTER — Other Ambulatory Visit: Payer: Self-pay | Admitting: Emergency Medicine

## 2020-08-19 DIAGNOSIS — E785 Hyperlipidemia, unspecified: Secondary | ICD-10-CM

## 2020-08-27 ENCOUNTER — Telehealth: Payer: Self-pay | Admitting: Emergency Medicine

## 2020-08-27 NOTE — Telephone Encounter (Signed)
   Patients daughter  returned call for lab results. She is also requesting that lab results be mailed to the address on file.

## 2020-08-30 NOTE — Telephone Encounter (Signed)
Called pt daughter to tell results, no answer. Mailed results in letter.

## 2020-09-01 ENCOUNTER — Encounter: Payer: Self-pay | Admitting: Internal Medicine

## 2021-02-24 ENCOUNTER — Ambulatory Visit: Payer: Medicare HMO | Admitting: Emergency Medicine

## 2021-04-27 ENCOUNTER — Encounter: Payer: Self-pay | Admitting: Emergency Medicine

## 2021-04-27 ENCOUNTER — Other Ambulatory Visit: Payer: Self-pay

## 2021-04-27 ENCOUNTER — Ambulatory Visit (INDEPENDENT_AMBULATORY_CARE_PROVIDER_SITE_OTHER): Payer: Medicare HMO | Admitting: Emergency Medicine

## 2021-04-27 VITALS — BP 110/82 | HR 92 | Temp 98.2°F | Ht 62.0 in | Wt 125.0 lb

## 2021-04-27 DIAGNOSIS — I10 Essential (primary) hypertension: Secondary | ICD-10-CM | POA: Diagnosis not present

## 2021-04-27 DIAGNOSIS — R7303 Prediabetes: Secondary | ICD-10-CM

## 2021-04-27 DIAGNOSIS — E785 Hyperlipidemia, unspecified: Secondary | ICD-10-CM | POA: Diagnosis not present

## 2021-04-27 LAB — COMPREHENSIVE METABOLIC PANEL
ALT: 18 U/L (ref 0–53)
AST: 21 U/L (ref 0–37)
Albumin: 4 g/dL (ref 3.5–5.2)
Alkaline Phosphatase: 69 U/L (ref 39–117)
BUN: 17 mg/dL (ref 6–23)
CO2: 25 mEq/L (ref 19–32)
Calcium: 9.2 mg/dL (ref 8.4–10.5)
Chloride: 104 mEq/L (ref 96–112)
Creatinine, Ser: 1.44 mg/dL (ref 0.40–1.50)
GFR: 46.1 mL/min — ABNORMAL LOW (ref >60.00)
Glucose, Bld: 142 mg/dL — ABNORMAL HIGH (ref 70–99)
Potassium: 4 mEq/L (ref 3.5–5.1)
Sodium: 137 mEq/L (ref 135–145)
Total Bilirubin: 0.3 mg/dL (ref 0.2–1.2)
Total Protein: 6.6 g/dL (ref 6.0–8.3)

## 2021-04-27 LAB — LDL CHOLESTEROL, DIRECT: Direct LDL: 135 mg/dL

## 2021-04-27 LAB — LIPID PANEL
Cholesterol: 246 mg/dL — ABNORMAL HIGH (ref 0–200)
HDL: 38.5 mg/dL — ABNORMAL LOW (ref >39.00)
Total CHOL/HDL Ratio: 6
Triglycerides: 644 mg/dL — ABNORMAL HIGH (ref 0.0–149.0)

## 2021-04-27 LAB — HEMOGLOBIN A1C: Hgb A1c MFr Bld: 6.2 % (ref 4.6–6.5)

## 2021-04-27 MED ORDER — LISINOPRIL 10 MG PO TABS: 10.0000 mg | ORAL_TABLET | Freq: Every day | ORAL | 3 refills | Status: DC

## 2021-04-27 MED ORDER — ROSUVASTATIN CALCIUM 20 MG PO TABS: 20.0000 mg | ORAL_TABLET | Freq: Every day | ORAL | 3 refills | Status: DC

## 2021-04-27 NOTE — Assessment & Plan Note (Signed)
Diet and nutrition discussed.  Hemoglobin A1c done today. 

## 2021-04-27 NOTE — Patient Instructions (Signed)

## 2021-04-27 NOTE — Progress Notes (Signed)
Kenneth Moore 81 y.o.   Chief Complaint  Patient presents with   Follow-up   Medication Refill    HISTORY OF PRESENT ILLNESS: This is a 81 y.o. male with history of hypertension and dyslipidemia here for follow-up and medication refill. Accompanied by daughter who is doing the translation. Has no complaints or medical concerns today. BP Readings from Last 3 Encounters:  08/18/20 130/70  05/07/20 139/73  01/01/20 130/70     Medication Refill Pertinent negatives include no abdominal pain, chest pain, chills, fever, nausea, rash or vomiting.    Prior to Admission medications   Medication Sig Start Date End Date Taking? Authorizing Provider  lisinopril (ZESTRIL) 10 MG tablet Take 1 tablet (10 mg total) by mouth daily. 08/18/20  Yes Kalyn Dimattia, Eilleen Kempf, MD  rosuvastatin (CRESTOR) 20 MG tablet Take 1 tablet (20 mg total) by mouth daily. 08/18/20  Yes Luan Urbani, Eilleen Kempf, MD  naproxen (NAPROSYN) 500 MG tablet Take 1 tablet (500 mg total) by mouth 2 (two) times daily. Patient not taking: Reported on 08/18/2020 05/07/20   Particia Nearing, PA-C    No Known Allergies  Patient Active Problem List   Diagnosis Date Noted   Dyslipidemia 01/02/2019   Prediabetes 01/02/2019   Essential hypertension 06/24/2018   Arthritis, shoulder region 11/07/2016    Past Medical History:  Diagnosis Date   Hypertension     No past surgical history on file.  Social History   Socioeconomic History   Marital status: Married    Spouse name: Not on file   Number of children: 3   Years of education: Not on file   Highest education level: Not on file  Occupational History   Not on file  Tobacco Use   Smoking status: Never   Smokeless tobacco: Never  Vaping Use   Vaping Use: Never used  Substance and Sexual Activity   Alcohol use: No   Drug use: Not Currently   Sexual activity: Not Currently  Other Topics Concern   Not on file  Social History Narrative   Not on file   Social  Determinants of Health   Financial Resource Strain: Not on file  Food Insecurity: Not on file  Transportation Needs: Not on file  Physical Activity: Not on file  Stress: Not on file  Social Connections: Not on file  Intimate Partner Violence: Not on file    No family history on file.   Review of Systems  Constitutional: Negative.  Negative for chills and fever.  HENT: Negative.    Eyes: Negative.   Respiratory: Negative.    Cardiovascular: Negative.  Negative for chest pain and palpitations.  Gastrointestinal: Negative.  Negative for abdominal pain, nausea and vomiting.  Genitourinary: Negative.   Skin: Negative.  Negative for rash.  All other systems reviewed and are negative. Today's Vitals   04/27/21 1520  BP: 110/82  Pulse: 92  Temp: 98.2 F (36.8 C)  SpO2: 98%  Weight: 125 lb (56.7 kg)  Height: 5\' 2"  (1.575 m)   Body mass index is 22.86 kg/m.   Physical Exam Vitals reviewed.  Constitutional:      Appearance: Normal appearance.  HENT:     Head: Normocephalic.     Mouth/Throat:     Mouth: Mucous membranes are moist.     Pharynx: Oropharynx is clear.  Eyes:     Extraocular Movements: Extraocular movements intact.     Conjunctiva/sclera: Conjunctivae normal.     Pupils: Pupils are equal, round, and reactive to  light.  Cardiovascular:     Rate and Rhythm: Normal rate and regular rhythm.     Pulses: Normal pulses.     Heart sounds: Normal heart sounds.  Pulmonary:     Effort: Pulmonary effort is normal.     Breath sounds: Normal breath sounds.  Abdominal:     Palpations: Abdomen is soft.     Tenderness: There is no abdominal tenderness.  Musculoskeletal:     Cervical back: No tenderness.     Right lower leg: No edema.     Left lower leg: No edema.  Lymphadenopathy:     Cervical: No cervical adenopathy.  Skin:    General: Skin is warm and dry.     Capillary Refill: Capillary refill takes less than 2 seconds.  Neurological:     General: No focal  deficit present.     Mental Status: He is alert and oriented to person, place, and time.  Psychiatric:        Mood and Affect: Mood normal.        Behavior: Behavior normal.     ASSESSMENT & PLAN: Problem List Items Addressed This Visit       Cardiovascular and Mediastinum   Essential hypertension - Primary    Well-controlled hypertension. Continue lisinopril 10 mg daily.      Relevant Medications   lisinopril (ZESTRIL) 10 MG tablet   rosuvastatin (CRESTOR) 20 MG tablet   Other Relevant Orders   Comprehensive metabolic panel     Other   Dyslipidemia    Diet and nutrition discussed.  Continue rosuvastatin 20 mg daily.  Lipid profile done today. The 10-year ASCVD risk score (Arnett DK, et al., 2019) is: 31.4%   Values used to calculate the score:     Age: 35 years     Sex: Male     Is Non-Hispanic African American: No     Diabetic: No     Tobacco smoker: No     Systolic Blood Pressure: 110 mmHg     Is BP treated: Yes     HDL Cholesterol: 37.2 mg/dL     Total Cholesterol: 253 mg/dL       Relevant Medications   rosuvastatin (CRESTOR) 20 MG tablet   Other Relevant Orders   Lipid panel   Prediabetes    Diet and nutrition discussed.  Hemoglobin A1c done today.      Relevant Orders   Hemoglobin A1c   Patient Instructions  Health Maintenance, Male Adopting a healthy lifestyle and getting preventive care are important in promoting health and wellness. Ask your health care provider about: The right schedule for you to have regular tests and exams. Things you can do on your own to prevent diseases and keep yourself healthy. What should I know about diet, weight, and exercise? Eat a healthy diet  Eat a diet that includes plenty of vegetables, fruits, low-fat dairy products, and lean protein. Do not eat a lot of foods that are high in solid fats, added sugars, or sodium. Maintain a healthy weight Body mass index (BMI) is a measurement that can be used to identify  possible weight problems. It estimates body fat based on height and weight. Your health care provider can help determine your BMI and help you achieve or maintain a healthy weight. Get regular exercise Get regular exercise. This is one of the most important things you can do for your health. Most adults should: Exercise for at least 150 minutes each week. The exercise should  increase your heart rate and make you sweat (moderate-intensity exercise). Do strengthening exercises at least twice a week. This is in addition to the moderate-intensity exercise. Spend less time sitting. Even light physical activity can be beneficial. Watch cholesterol and blood lipids Have your blood tested for lipids and cholesterol at 80 years of age, then have this test every 5 years. You may need to have your cholesterol levels checked more often if: Your lipid or cholesterol levels are high. You are older than 80 years of age. You are at high risk for heart disease. What should I know about cancer screening? Many types of cancers can be detected early and may often be prevented. Depending on your health history and family history, you may need to have cancer screening at various ages. This may include screening for: Colorectal cancer. Prostate cancer. Skin cancer. Lung cancer. What should I know about heart disease, diabetes, and high blood pressure? Blood pressure and heart disease High blood pressure causes heart disease and increases the risk of stroke. This is more likely to develop in people who have high blood pressure readings or are overweight. Talk with your health care provider about your target blood pressure readings. Have your blood pressure checked: Every 3-5 years if you are 42-43 years of age. Every year if you are 75 years old or older. If you are between the ages of 87 and 97 and are a current or former smoker, ask your health care provider if you should have a one-time screening for abdominal  aortic aneurysm (AAA). Diabetes Have regular diabetes screenings. This checks your fasting blood sugar level. Have the screening done: Once every three years after age 70 if you are at a normal weight and have a low risk for diabetes. More often and at a younger age if you are overweight or have a high risk for diabetes. What should I know about preventing infection? Hepatitis B If you have a higher risk for hepatitis B, you should be screened for this virus. Talk with your health care provider to find out if you are at risk for hepatitis B infection. Hepatitis C Blood testing is recommended for: Everyone born from [birth year] through 1964. Anyone with known risk factors for hepatitis C. Sexually transmitted infections (STIs) You should be screened each year for STIs, including gonorrhea and chlamydia, if: You are sexually active and are younger than 80 years of age. You are older than 80 years of age and your health care provider tells you that you are at risk for this type of infection. Your sexual activity has changed since you were last screened, and you are at increased risk for chlamydia or gonorrhea. Ask your health care provider if you are at risk. Ask your health care provider about whether you are at high risk for HIV. Your health care provider may recommend a prescription medicine to help prevent HIV infection. If you choose to take medicine to prevent HIV, you should first get tested for HIV. You should then be tested every 3 months for as long as you are taking the medicine. Follow these instructions at home: Alcohol use Do not drink alcohol if your health care provider tells you not to drink. If you drink alcohol: Limit how much you have to 0-2 drinks a day. Know how much alcohol is in your drink. In the [place] one drink equals one 12 oz bottle of beer (355 mL), one 5 oz glass of wine (148 mL), or one 1 oz glass  of hard liquor (44 mL). Lifestyle Do not use any products that contain  nicotine or tobacco. These products include cigarettes, chewing tobacco, and vaping devices, such as e-cigarettes. If you need help quitting, ask your health care provider. Do not use street drugs. Do not share needles. Ask your health care provider for help if you need support or information about quitting drugs. General instructions Schedule regular health, dental, and eye exams. Stay current with your vaccines. Tell your health care provider if: You often feel depressed. You have ever been abused or do not feel safe at home. Summary Adopting a healthy lifestyle and getting preventive care are important in promoting health and wellness. Follow your health care provider's instructions about healthy diet, exercising, and getting tested or screened for diseases. Follow your health care provider's instructions on monitoring your cholesterol and blood pressure. This information is not intended to replace advice given to you by your health care provider. Make sure you discuss any questions you have with your health care provider. Document Revised: 06/21/2020 Document Reviewed: 06/21/2020 Elsevier Patient Education  2022 Elsevier Inc.     Edwina Barth, MD [facility] [place]

## 2021-04-27 NOTE — Assessment & Plan Note (Signed)
Well-controlled hypertension. Continue lisinopril 10 mg daily 

## 2021-04-27 NOTE — Assessment & Plan Note (Signed)
Diet and nutrition discussed.  Continue rosuvastatin 20 mg daily.  Lipid profile done today. ?The 10-year ASCVD risk score (Arnett DK, et al., 2019) is: 31.4% ?  Values used to calculate the score: ?    Age: 81 years ?    Sex: Male ?    Is Non-Hispanic African American: No ?    Diabetic: No ?    Tobacco smoker: No ?    Systolic Blood Pressure: 110 mmHg ?    Is BP treated: Yes ?    HDL Cholesterol: 37.2 mg/dL ?    Total Cholesterol: 253 mg/dL ? ?

## 2021-04-28 ENCOUNTER — Other Ambulatory Visit: Payer: Self-pay | Admitting: Emergency Medicine

## 2021-04-28 DIAGNOSIS — E781 Pure hyperglyceridemia: Secondary | ICD-10-CM | POA: Insufficient documentation

## 2021-04-28 MED ORDER — FENOFIBRATE 67 MG PO CAPS: 67.0000 mg | ORAL_CAPSULE | Freq: Every day | ORAL | 3 refills | Status: DC

## 2021-05-02 ENCOUNTER — Telehealth: Payer: Self-pay | Admitting: Emergency Medicine

## 2021-05-02 NOTE — Telephone Encounter (Signed)
Yes

## 2021-05-02 NOTE — Telephone Encounter (Signed)
Pts daughter inquiring if patient should continue taking lisinopril (ZESTRIL) 10 MG tablet along w/ new rx fenofibrate micronized (LOFIBRA) 67 MG capsule ? ?Daughter requesting a cb ? ?Phone [phone] ?

## 2021-05-03 NOTE — Telephone Encounter (Signed)
Called patient daughter and left voice message in reference to the medication question. Informed of provider response  ?

## 2021-05-12 ENCOUNTER — Ambulatory Visit: Payer: Medicare HMO | Admitting: Emergency Medicine

## 2021-10-27 ENCOUNTER — Ambulatory Visit (INDEPENDENT_AMBULATORY_CARE_PROVIDER_SITE_OTHER): Payer: Medicare HMO | Admitting: Emergency Medicine

## 2021-10-27 ENCOUNTER — Encounter: Payer: Self-pay | Admitting: Emergency Medicine

## 2021-10-27 VITALS — BP 130/70 | HR 65 | Temp 97.9°F | Ht 62.0 in | Wt 123.0 lb

## 2021-10-27 DIAGNOSIS — E785 Hyperlipidemia, unspecified: Secondary | ICD-10-CM | POA: Diagnosis not present

## 2021-10-27 DIAGNOSIS — I10 Essential (primary) hypertension: Secondary | ICD-10-CM | POA: Diagnosis not present

## 2021-10-27 DIAGNOSIS — R7303 Prediabetes: Secondary | ICD-10-CM

## 2021-10-27 LAB — LIPID PANEL
Cholesterol: 186 mg/dL (ref 0–200)
HDL: 40.7 mg/dL (ref >39.00)
NonHDL: 144.97
Total CHOL/HDL Ratio: 5
Triglycerides: 299 mg/dL — ABNORMAL HIGH (ref 0.0–149.0)
VLDL: 59.8 mg/dL — ABNORMAL HIGH (ref 0.0–40.0)

## 2021-10-27 LAB — COMPREHENSIVE METABOLIC PANEL
ALT: 19 U/L (ref 0–53)
AST: 23 U/L (ref 0–37)
Albumin: 3.9 g/dL (ref 3.5–5.2)
Alkaline Phosphatase: 71 U/L (ref 39–117)
BUN: 16 mg/dL (ref 6–23)
CO2: 25 mEq/L (ref 19–32)
Calcium: 8.9 mg/dL (ref 8.4–10.5)
Chloride: 105 mEq/L (ref 96–112)
Creatinine, Ser: 1.17 mg/dL (ref 0.40–1.50)
GFR: 58.93 mL/min — ABNORMAL LOW (ref >60.00)
Glucose, Bld: 155 mg/dL — ABNORMAL HIGH (ref 70–99)
Potassium: 3.8 mEq/L (ref 3.5–5.1)
Sodium: 137 mEq/L (ref 135–145)
Total Bilirubin: 0.6 mg/dL (ref 0.2–1.2)
Total Protein: 6.9 g/dL (ref 6.0–8.3)

## 2021-10-27 LAB — LDL CHOLESTEROL, DIRECT: Direct LDL: 129 mg/dL

## 2021-10-27 MED ORDER — ROSUVASTATIN CALCIUM 20 MG PO TABS: 20.0000 mg | ORAL_TABLET | Freq: Every day | ORAL | 3 refills | Status: DC

## 2021-10-27 MED ORDER — LISINOPRIL 10 MG PO TABS: 10.0000 mg | ORAL_TABLET | Freq: Every day | ORAL | 3 refills | Status: DC

## 2021-10-27 NOTE — Assessment & Plan Note (Signed)
Stable.  Diet and nutrition discussed. Blood work done today. 

## 2021-10-27 NOTE — Assessment & Plan Note (Signed)
Stable.  Diet and nutrition discussed.  Continue rosuvastatin 20 mg daily.  

## 2021-10-27 NOTE — Assessment & Plan Note (Signed)
Well-controlled hypertension. Continue lisinopril 10 mg daily. BP Readings from Last 3 Encounters:  10/27/21 130/70  04/27/21 110/82  08/18/20 130/70

## 2021-10-27 NOTE — Progress Notes (Signed)
Kenneth Moore 81 y.o.   Chief Complaint  Patient presents with   Follow-up    No concerns     HISTORY OF PRESENT ILLNESS: This is a 81 y.o. male with history of hypertension and dyslipidemia here for follow-up.  Accompanied by wife and daughter.  Overall doing well. Has no complaints or medical concerns today.  HPI   Prior to Admission medications   Medication Sig Start Date End Date Taking? Authorizing Provider  fenofibrate micronized (LOFIBRA) 67 MG capsule Take 1 capsule (67 mg total) by mouth daily before breakfast. Patient not taking: Reported on 10/27/2021 04/28/21 04/23/22  Georgina Quint, MD  lisinopril (ZESTRIL) 10 MG tablet Take 1 tablet (10 mg total) by mouth daily. 10/27/21   Georgina Quint, MD  naproxen (NAPROSYN) 500 MG tablet Take 1 tablet (500 mg total) by mouth 2 (two) times daily. Patient not taking: Reported on 08/18/2020 05/07/20   Particia Nearing, PA-C  rosuvastatin (CRESTOR) 20 MG tablet Take 1 tablet (20 mg total) by mouth daily. 10/27/21   Georgina Quint, MD    No Known Allergies  Patient Active Problem List   Diagnosis Date Noted   Hypertriglyceridemia 04/28/2021   Dyslipidemia 01/02/2019   Prediabetes 01/02/2019   Essential hypertension 06/24/2018   Arthritis, shoulder region 11/07/2016    Past Medical History:  Diagnosis Date   Hypertension     History reviewed. No pertinent surgical history.  Social History   Socioeconomic History   Marital status: Married    Spouse name: Not on file   Number of children: 3   Years of education: Not on file   Highest education level: Not on file  Occupational History   Not on file  Tobacco Use   Smoking status: Never   Smokeless tobacco: Never  Vaping Use   Vaping Use: Never used  Substance and Sexual Activity   Alcohol use: No   Drug use: Not Currently   Sexual activity: Not Currently  Other Topics Concern   Not on file  Social History Narrative   Not on file   Social  Determinants of Health   Financial Resource Strain: Not on file  Food Insecurity: Not on file  Transportation Needs: Not on file  Physical Activity: Not on file  Stress: Not on file  Social Connections: Not on file  Intimate Partner Violence: Not on file    History reviewed. No pertinent family history.   Review of Systems  Constitutional: Negative.  Negative for chills and fever.  HENT: Negative.  Negative for congestion and sore throat.   Respiratory: Negative.  Negative for cough and shortness of breath.   Cardiovascular: Negative.  Negative for chest pain and palpitations.  Gastrointestinal:  Negative for abdominal pain, diarrhea, nausea and vomiting.  Genitourinary: Negative.   Skin: Negative.  Negative for rash.  Neurological: Negative.  Negative for dizziness and headaches.  All other systems reviewed and are negative.  Today's Vitals   10/27/21 1524  BP: 130/70  Pulse: 65  Temp: 97.9 F (36.6 C)  TempSrc: Oral  SpO2: 97%  Weight: 123 lb (55.8 kg)  Height: 5\' 2"  (1.575 m)   Body mass index is 22.5 kg/m.   Physical Exam Vitals reviewed.  Constitutional:      Appearance: Normal appearance.  HENT:     Head: Normocephalic.     Mouth/Throat:     Mouth: Mucous membranes are moist.     Pharynx: Oropharynx is clear.  Eyes:  Extraocular Movements: Extraocular movements intact.     Conjunctiva/sclera: Conjunctivae normal.     Pupils: Pupils are equal, round, and reactive to light.  Cardiovascular:     Rate and Rhythm: Normal rate and regular rhythm.     Pulses: Normal pulses.     Heart sounds: Normal heart sounds.  Pulmonary:     Effort: Pulmonary effort is normal.     Breath sounds: Normal breath sounds.  Abdominal:     Palpations: Abdomen is soft.     Tenderness: There is no abdominal tenderness.  Musculoskeletal:     Cervical back: No tenderness.     Right lower leg: No edema.     Left lower leg: No edema.  Lymphadenopathy:     Cervical: No  cervical adenopathy.  Skin:    General: Skin is warm and dry.     Capillary Refill: Capillary refill takes less than 2 seconds.  Neurological:     General: No focal deficit present.     Mental Status: He is alert and oriented to person, place, and time.  Psychiatric:        Mood and Affect: Mood normal.        Behavior: Behavior normal.      ASSESSMENT & PLAN: A total of 45 minutes was spent with the patient and counseling/coordination of care regarding preparing for this visit, review of most recent office visit notes, review of multiple chronic medical problems and their management, review of all medications, review of most recent blood work results, cardiovascular risks associated with hypertension and dyslipidemia, education on nutrition, prognosis, documentation, and need for follow-up.  Problem List Items Addressed This Visit       Cardiovascular and Mediastinum   Essential hypertension - Primary    Well-controlled hypertension. Continue lisinopril 10 mg daily. BP Readings from Last 3 Encounters:  10/27/21 130/70  04/27/21 110/82  08/18/20 130/70         Relevant Medications   lisinopril (ZESTRIL) 10 MG tablet   rosuvastatin (CRESTOR) 20 MG tablet   Other Relevant Orders   Comprehensive metabolic panel     Other   Dyslipidemia    Stable.  Diet and nutrition discussed. Continue rosuvastatin 20 mg daily.       Relevant Medications   rosuvastatin (CRESTOR) 20 MG tablet   Other Relevant Orders   Lipid panel   Prediabetes    Stable.  Diet and nutrition discussed. Blood work done today.      Relevant Orders   Hemoglobin A1c   Patient Instructions  Hypertension, Adult High blood pressure (hypertension) is when the force of blood pumping through the arteries is too strong. The arteries are the blood vessels that carry blood from the heart throughout the body. Hypertension forces the heart to work harder to pump blood and may cause arteries to become narrow or  stiff. Untreated or uncontrolled hypertension can lead to a heart attack, heart failure, a stroke, kidney disease, and other problems. A blood pressure reading consists of a higher number over a lower number. Ideally, your blood pressure should be below 120/80. The first ("top") number is called the systolic pressure. It is a measure of the pressure in your arteries as your heart beats. The second ("bottom") number is called the diastolic pressure. It is a measure of the pressure in your arteries as the heart relaxes. What are the causes? The exact cause of this condition is not known. There are some conditions that result in high blood  pressure. What increases the risk? Certain factors may make you more likely to develop high blood pressure. Some of these risk factors are under your control, including: Smoking. Not getting enough exercise or physical activity. Being overweight. Having too much fat, sugar, calories, or salt (sodium) in your diet. Drinking too much alcohol. Other risk factors include: Having a personal history of heart disease, diabetes, high cholesterol, or kidney disease. Stress. Having a family history of high blood pressure and high cholesterol. Having obstructive sleep apnea. Age. The risk increases with age. What are the signs or symptoms? High blood pressure may not cause symptoms. Very high blood pressure (hypertensive crisis) may cause: Headache. Fast or irregular heartbeats (palpitations). Shortness of breath. Nosebleed. Nausea and vomiting. Vision changes. Severe chest pain, dizziness, and seizures. How is this diagnosed? This condition is diagnosed by measuring your blood pressure while you are seated, with your arm resting on a flat surface, your legs uncrossed, and your feet flat on the floor. The cuff of the blood pressure monitor will be placed directly against the skin of your upper arm at the level of your heart. Blood pressure should be measured at least  twice using the same arm. Certain conditions can cause a difference in blood pressure between your right and left arms. If you have a high blood pressure reading during one visit or you have normal blood pressure with other risk factors, you may be asked to: Return on a different day to have your blood pressure checked again. Monitor your blood pressure at home for 1 week or longer. If you are diagnosed with hypertension, you may have other blood or imaging tests to help your health care provider understand your overall risk for other conditions. How is this treated? This condition is treated by making healthy lifestyle changes, such as eating healthy foods, exercising more, and reducing your alcohol intake. You may be referred for counseling on a healthy diet and physical activity. Your health care provider may prescribe medicine if lifestyle changes are not enough to get your blood pressure under control and if: Your systolic blood pressure is above 130. Your diastolic blood pressure is above 80. Your personal target blood pressure may vary depending on your medical conditions, your age, and other factors. Follow these instructions at home: Eating and drinking  Eat a diet that is high in fiber and potassium, and low in sodium, added sugar, and fat. An example of this eating plan is called the DASH diet. DASH stands for Dietary Approaches to Stop Hypertension. To eat this way: Eat plenty of fresh fruits and vegetables. Try to fill one half of your plate at each meal with fruits and vegetables. Eat whole grains, such as whole-wheat pasta, brown rice, or whole-grain bread. Fill about one fourth of your plate with whole grains. Eat or drink low-fat dairy products, such as skim milk or low-fat yogurt. Avoid fatty cuts of meat, processed or cured meats, and poultry with skin. Fill about one fourth of your plate with lean proteins, such as fish, chicken without skin, beans, eggs, or tofu. Avoid  pre-made and processed foods. These tend to be higher in sodium, added sugar, and fat. Reduce your daily sodium intake. Many people with hypertension should eat less than 1,500 mg of sodium a day. Do not drink alcohol if: Your health care provider tells you not to drink. You are pregnant, may be pregnant, or are planning to become pregnant. If you drink alcohol: Limit how much you have to:  0-1 drink a day for women. 0-2 drinks a day for men. Know how much alcohol is in your drink. In the [place] one drink equals one 12 oz bottle of beer (355 mL), one 5 oz glass of wine (148 mL), or one 1 oz glass of hard liquor (44 mL). Lifestyle  Work with your health care provider to maintain a healthy body weight or to lose weight. Ask what an ideal weight is for you. Get at least 30 minutes of exercise that causes your heart to beat faster (aerobic exercise) most days of the week. Activities may include walking, swimming, or biking. Include exercise to strengthen your muscles (resistance exercise), such as Pilates or lifting weights, as part of your weekly exercise routine. Try to do these types of exercises for 30 minutes at least 3 days a week. Do not use any products that contain nicotine or tobacco. These products include cigarettes, chewing tobacco, and vaping devices, such as e-cigarettes. If you need help quitting, ask your health care provider. Monitor your blood pressure at home as told by your health care provider. Keep all follow-up visits. This is important. Medicines Take over-the-counter and prescription medicines only as told by your health care provider. Follow directions carefully. Blood pressure medicines must be taken as prescribed. Do not skip doses of blood pressure medicine. Doing this puts you at risk for problems and can make the medicine less effective. Ask your health care provider about side effects or reactions to medicines that you should watch for. Contact a health care provider  if you: Think you are having a reaction to a medicine you are taking. Have headaches that keep coming back (recurring). Feel dizzy. Have swelling in your ankles. Have trouble with your vision. Get help right away if you: Develop a severe headache or confusion. Have unusual weakness or numbness. Feel faint. Have severe pain in your chest or abdomen. Vomit repeatedly. Have trouble breathing. These symptoms may be an emergency. Get help right away. Call 911. Do not wait to see if the symptoms will go away. Do not drive yourself to the hospital. Summary Hypertension is when the force of blood pumping through your arteries is too strong. If this condition is not controlled, it may put you at risk for serious complications. Your personal target blood pressure may vary depending on your medical conditions, your age, and other factors. For most people, a normal blood pressure is less than 120/80. Hypertension is treated with lifestyle changes, medicines, or a combination of both. Lifestyle changes include losing weight, eating a healthy, low-sodium diet, exercising more, and limiting alcohol. This information is not intended to replace advice given to you by your health care provider. Make sure you discuss any questions you have with your health care provider. Document Revised: 12/07/2020 Document Reviewed: 12/07/2020 Elsevier Patient Education  2023 Elsevier Inc.    Edwina Barth, MD [facility]

## 2021-10-27 NOTE — Patient Instructions (Signed)
Hypertension, Adult High blood pressure (hypertension) is when the force of blood pumping through the arteries is too strong. The arteries are the blood vessels that carry blood from the heart throughout the body. Hypertension forces the heart to work harder to pump blood and may cause arteries to become narrow or stiff. Untreated or uncontrolled hypertension can lead to a heart attack, heart failure, a stroke, kidney disease, and other problems. A blood pressure reading consists of a higher number over a lower number. Ideally, your blood pressure should be below 120/80. The first ("top") number is called the systolic pressure. It is a measure of the pressure in your arteries as your heart beats. The second ("bottom") number is called the diastolic pressure. It is a measure of the pressure in your arteries as the heart relaxes. What are the causes? The exact cause of this condition is not known. There are some conditions that result in high blood pressure. What increases the risk? Certain factors may make you more likely to develop high blood pressure. Some of these risk factors are under your control, including: Smoking. Not getting enough exercise or physical activity. Being overweight. Having too much fat, sugar, calories, or salt (sodium) in your diet. Drinking too much alcohol. Other risk factors include: Having a personal history of heart disease, diabetes, high cholesterol, or kidney disease. Stress. Having a family history of high blood pressure and high cholesterol. Having obstructive sleep apnea. Age. The risk increases with age. What are the signs or symptoms? High blood pressure may not cause symptoms. Very high blood pressure (hypertensive crisis) may cause: Headache. Fast or irregular heartbeats (palpitations). Shortness of breath. Nosebleed. Nausea and vomiting. Vision changes. Severe chest pain, dizziness, and seizures. How is this diagnosed? This condition is diagnosed by  measuring your blood pressure while you are seated, with your arm resting on a flat surface, your legs uncrossed, and your feet flat on the floor. The cuff of the blood pressure monitor will be placed directly against the skin of your upper arm at the level of your heart. Blood pressure should be measured at least twice using the same arm. Certain conditions can cause a difference in blood pressure between your right and left arms. If you have a high blood pressure reading during one visit or you have normal blood pressure with other risk factors, you may be asked to: Return on a different day to have your blood pressure checked again. Monitor your blood pressure at home for 1 week or longer. If you are diagnosed with hypertension, you may have other blood or imaging tests to help your health care provider understand your overall risk for other conditions. How is this treated? This condition is treated by making healthy lifestyle changes, such as eating healthy foods, exercising more, and reducing your alcohol intake. You may be referred for counseling on a healthy diet and physical activity. Your health care provider may prescribe medicine if lifestyle changes are not enough to get your blood pressure under control and if: Your systolic blood pressure is above 130. Your diastolic blood pressure is above 80. Your personal target blood pressure may vary depending on your medical conditions, your age, and other factors. Follow these instructions at home: Eating and drinking  Eat a diet that is high in fiber and potassium, and low in sodium, added sugar, and fat. An example of this eating plan is called the DASH diet. DASH stands for Dietary Approaches to Stop Hypertension. To eat this way: Eat   plenty of fresh fruits and vegetables. Try to fill one half of your plate at each meal with fruits and vegetables. Eat whole grains, such as whole-wheat pasta, brown rice, or whole-grain bread. Fill about one  fourth of your plate with whole grains. Eat or drink low-fat dairy products, such as skim milk or low-fat yogurt. Avoid fatty cuts of meat, processed or cured meats, and poultry with skin. Fill about one fourth of your plate with lean proteins, such as fish, chicken without skin, beans, eggs, or tofu. Avoid pre-made and processed foods. These tend to be higher in sodium, added sugar, and fat. Reduce your daily sodium intake. Many people with hypertension should eat less than 1,500 mg of sodium a day. Do not drink alcohol if: Your health care provider tells you not to drink. You are pregnant, may be pregnant, or are planning to become pregnant. If you drink alcohol: Limit how much you have to: 0-1 drink a day for women. 0-2 drinks a day for men. Know how much alcohol is in your drink. In the [place] one drink equals one 12 oz bottle of beer (355 mL), one 5 oz glass of wine (148 mL), or one 1 oz glass of hard liquor (44 mL). Lifestyle  Work with your health care provider to maintain a healthy body weight or to lose weight. Ask what an ideal weight is for you. Get at least 30 minutes of exercise that causes your heart to beat faster (aerobic exercise) most days of the week. Activities may include walking, swimming, or biking. Include exercise to strengthen your muscles (resistance exercise), such as Pilates or lifting weights, as part of your weekly exercise routine. Try to do these types of exercises for 30 minutes at least 3 days a week. Do not use any products that contain nicotine or tobacco. These products include cigarettes, chewing tobacco, and vaping devices, such as e-cigarettes. If you need help quitting, ask your health care provider. Monitor your blood pressure at home as told by your health care provider. Keep all follow-up visits. This is important. Medicines Take over-the-counter and prescription medicines only as told by your health care provider. Follow directions carefully. Blood  pressure medicines must be taken as prescribed. Do not skip doses of blood pressure medicine. Doing this puts you at risk for problems and can make the medicine less effective. Ask your health care provider about side effects or reactions to medicines that you should watch for. Contact a health care provider if you: Think you are having a reaction to a medicine you are taking. Have headaches that keep coming back (recurring). Feel dizzy. Have swelling in your ankles. Have trouble with your vision. Get help right away if you: Develop a severe headache or confusion. Have unusual weakness or numbness. Feel faint. Have severe pain in your chest or abdomen. Vomit repeatedly. Have trouble breathing. These symptoms may be an emergency. Get help right away. Call 911. Do not wait to see if the symptoms will go away. Do not drive yourself to the hospital. Summary Hypertension is when the force of blood pumping through your arteries is too strong. If this condition is not controlled, it may put you at risk for serious complications. Your personal target blood pressure may vary depending on your medical conditions, your age, and other factors. For most people, a normal blood pressure is less than 120/80. Hypertension is treated with lifestyle changes, medicines, or a combination of both. Lifestyle changes include losing weight, eating a healthy,   low-sodium diet, exercising more, and limiting alcohol. This information is not intended to replace advice given to you by your health care provider. Make sure you discuss any questions you have with your health care provider. Document Revised: 12/07/2020 Document Reviewed: 12/07/2020 Elsevier Patient Education  2023 Elsevier Inc.  

## 2021-10-28 LAB — HEMOGLOBIN A1C: Hgb A1c MFr Bld: 6.2 % (ref 4.6–6.5)

## 2021-10-31 ENCOUNTER — Telehealth: Payer: Self-pay | Admitting: Emergency Medicine

## 2021-10-31 NOTE — Telephone Encounter (Signed)
Patient's daughter called and said that he does not want to take the finofibrate.  Patient wants to stay on the resuvastatin - please send refill on this to patient's pharmacy - [facility]

## 2021-11-01 ENCOUNTER — Other Ambulatory Visit: Payer: Self-pay | Admitting: Emergency Medicine

## 2021-11-01 DIAGNOSIS — E785 Hyperlipidemia, unspecified: Secondary | ICD-10-CM

## 2021-11-01 MED ORDER — ROSUVASTATIN CALCIUM 20 MG PO TABS: 20.0000 mg | ORAL_TABLET | Freq: Every day | ORAL | 3 refills | Status: DC

## 2021-11-01 NOTE — Telephone Encounter (Signed)
Called patient was unable to leave message pertaining to provider response and recommendation.

## 2021-11-01 NOTE — Telephone Encounter (Signed)
New prescription for rosuvastatin sent to pharmacy requested.  Agree with not taking fenofibrate.  Thanks.

## 2021-11-07 IMAGING — DX DG SHOULDER 2+V*L*
3 series · 3 of 3 positions shown · non-contrast
Comparison: 11/07/2016

CLINICAL DATA: Pain over the last 2 weeks.

EXAM:
LEFT SHOULDER - 2+ VIEW

[shoulder grashey]
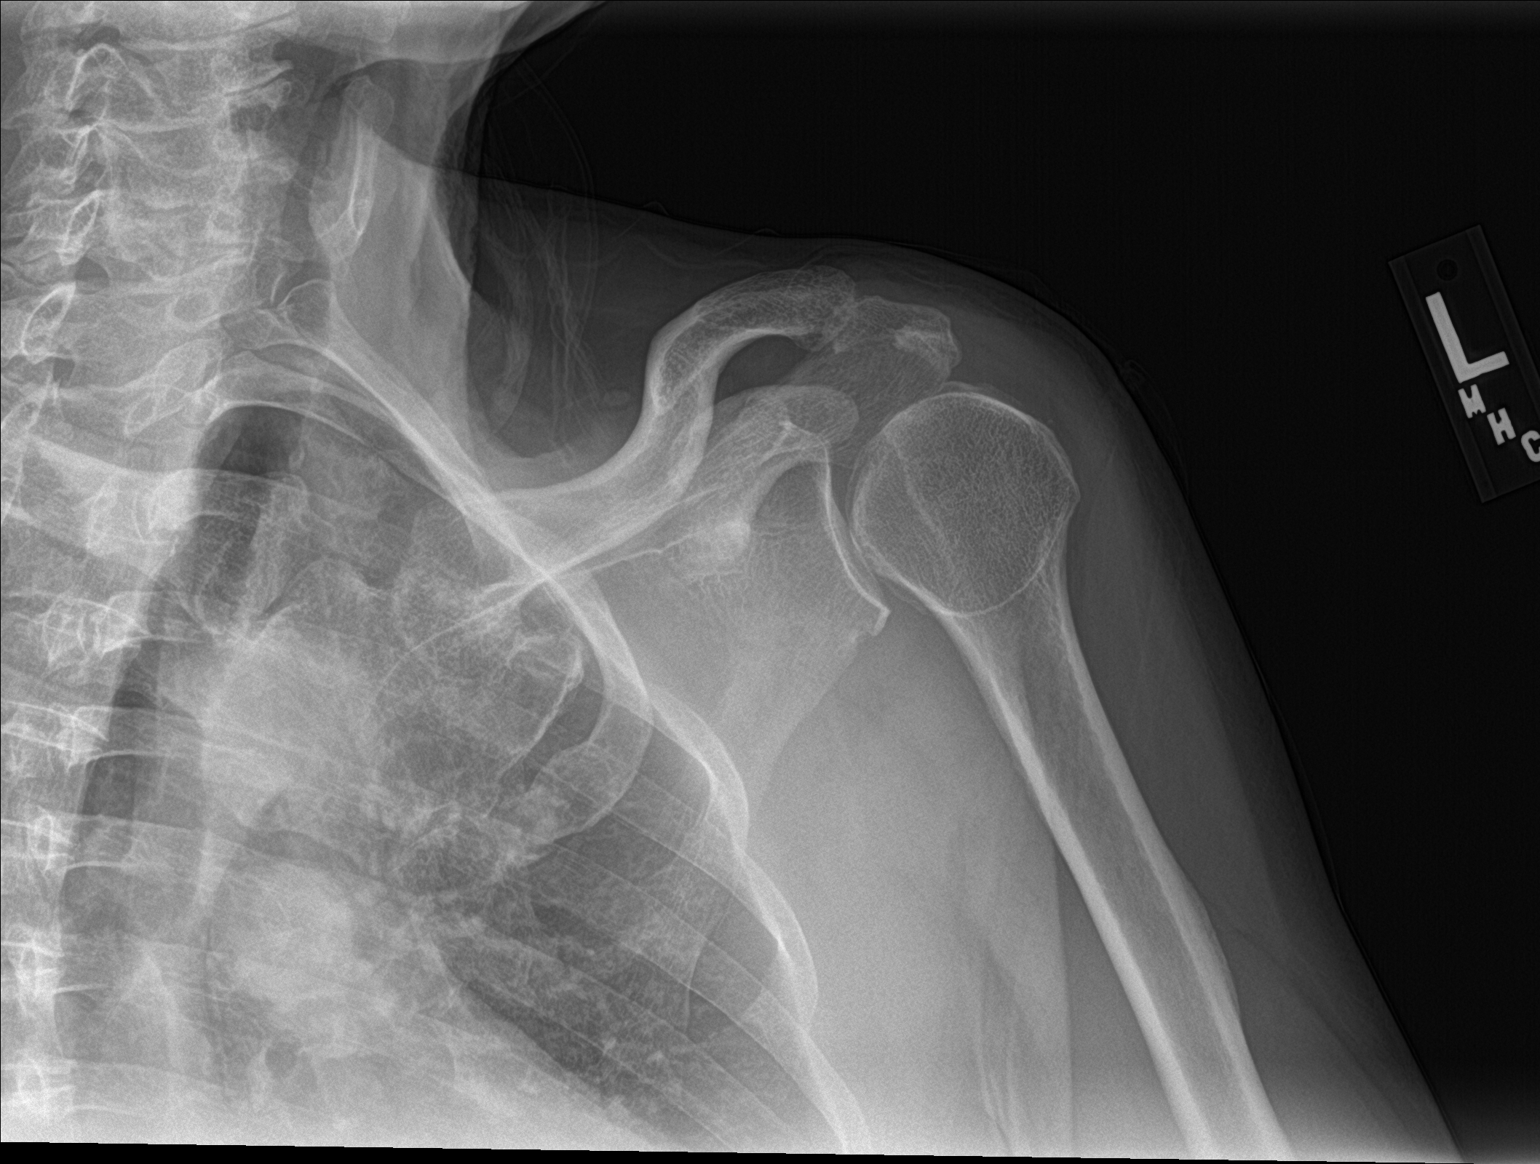

[shoulder y-view]
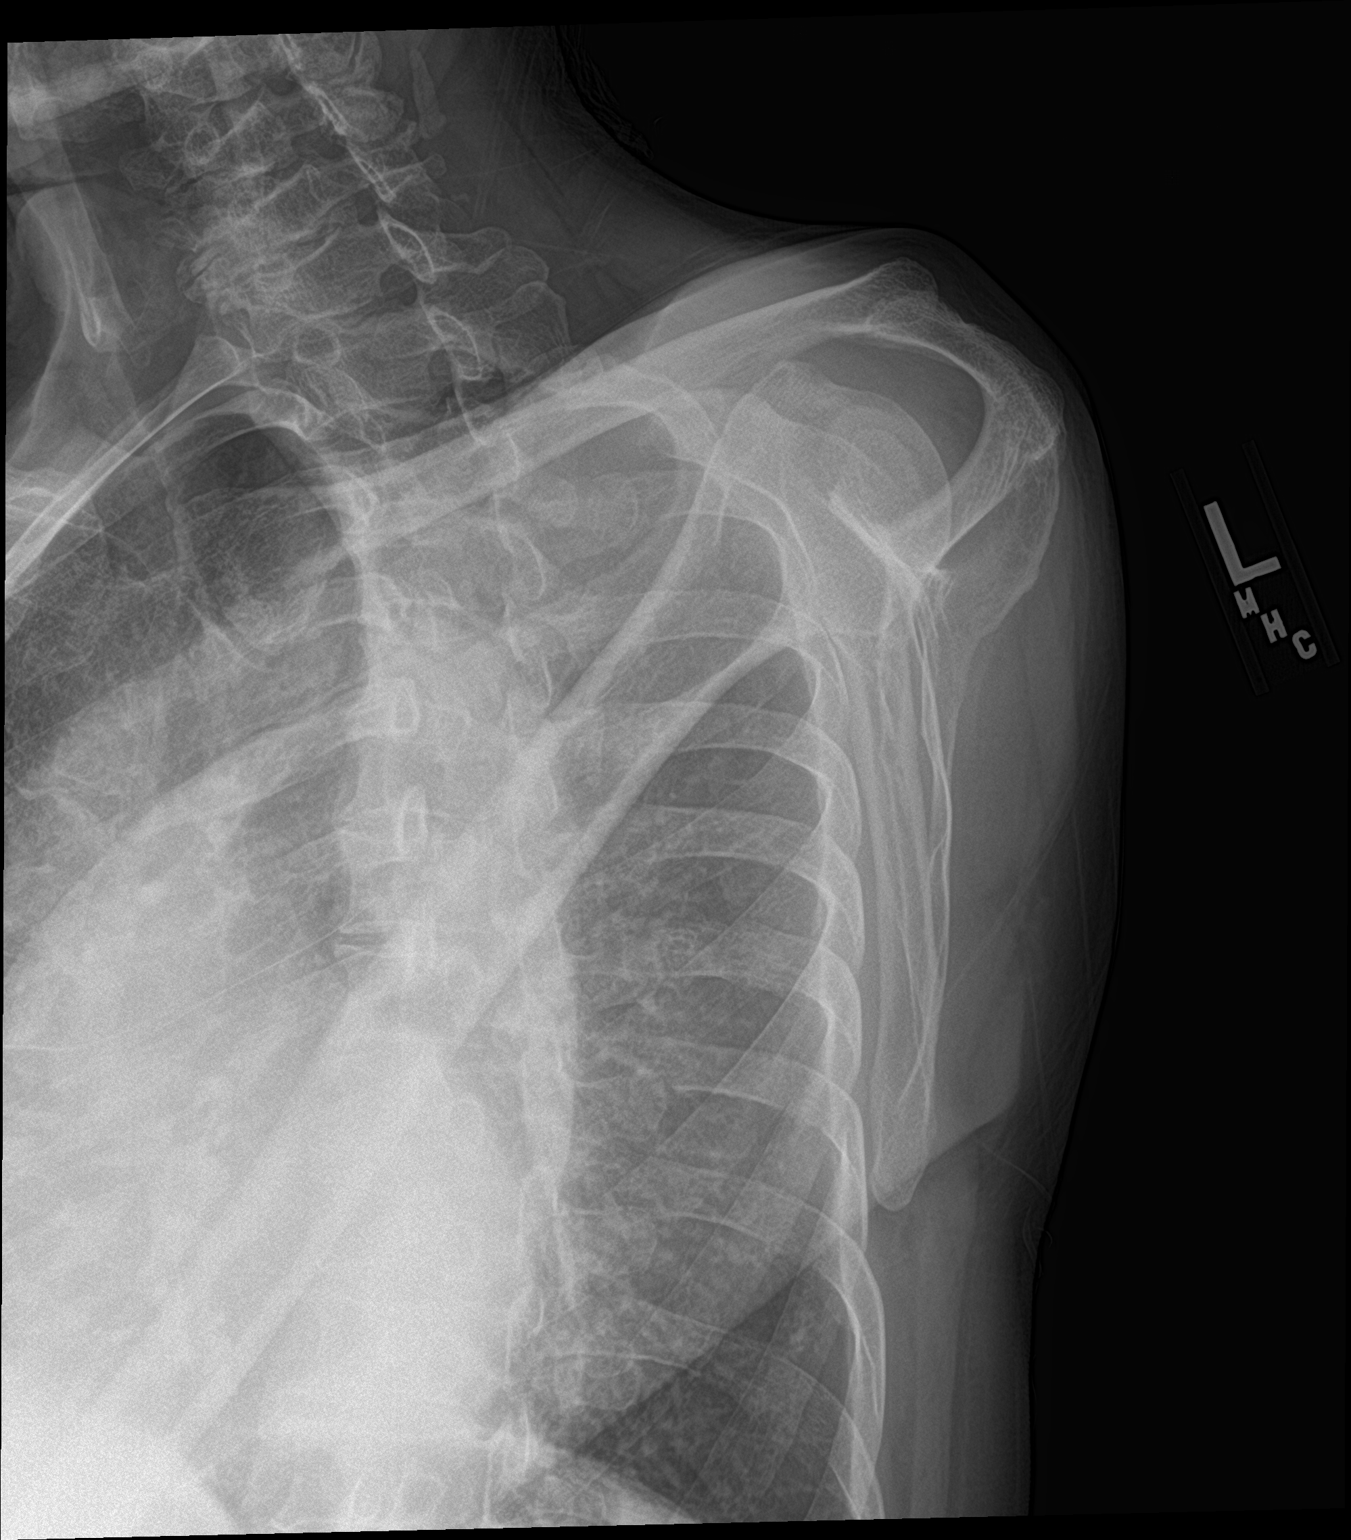

[shoulder axial]
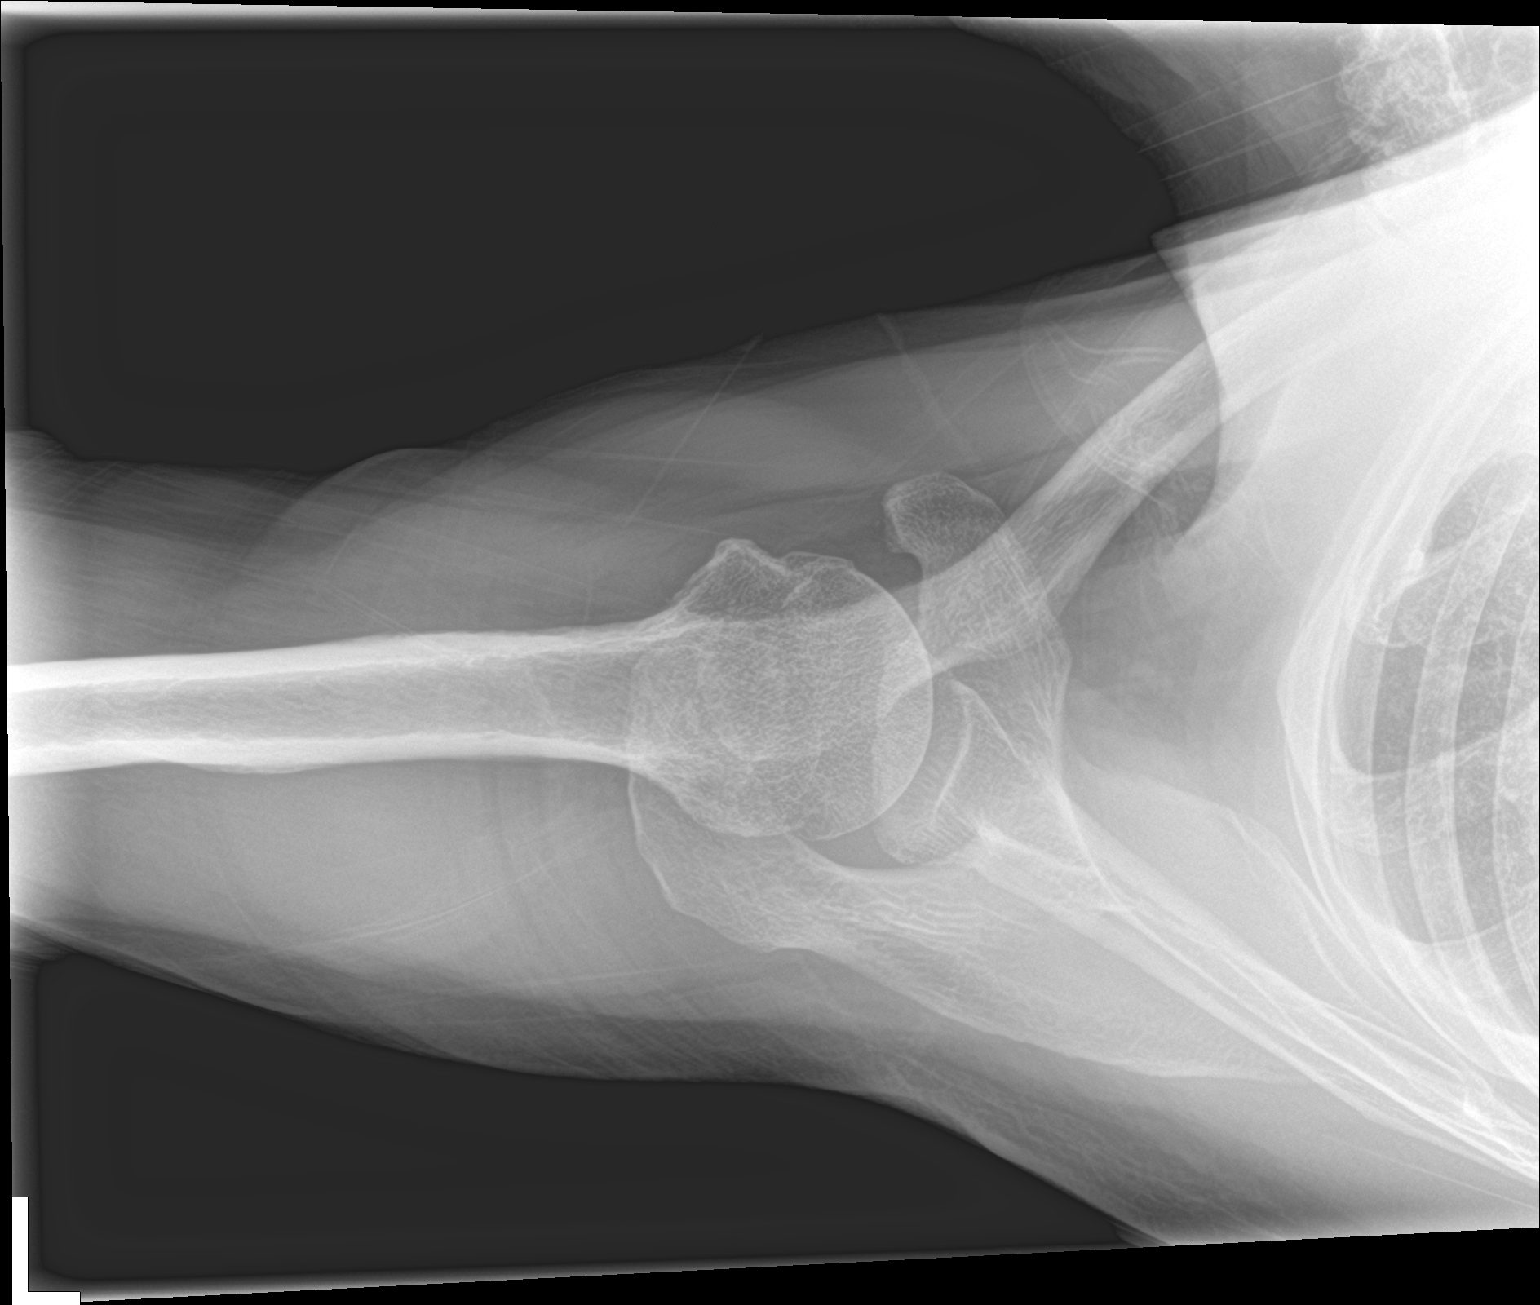

[3 of 3 positions shown; findings below may reference images not displayed]

FINDINGS: Humeral head is properly located. No evidence of fracture or focal
lesion. Lateral sloping and pointed acromion could predispose to
rotator cuff disease. Mild degenerative arthritis of the AC joint.
IMPRESSION: No acute or traumatic finding. Lateral sloping and pointed acromion
could predispose to rotator cuff disease. Mild AC joint
osteoarthritis.

## 2021-11-30 DIAGNOSIS — L409 Psoriasis, unspecified: Secondary | ICD-10-CM | POA: Diagnosis not present

## 2021-12-09 ENCOUNTER — Emergency Department (HOSPITAL_COMMUNITY): Payer: Medicare HMO

## 2021-12-09 ENCOUNTER — Emergency Department (HOSPITAL_COMMUNITY)
Admission: EM | Admit: 2021-12-09 | Discharge: 2021-12-09 | Disposition: A | Discharge status: Home / Self Care | Payer: Medicare HMO | Attending: Emergency Medicine | Admitting: Emergency Medicine

## 2021-12-09 DIAGNOSIS — M47812 Spondylosis without myelopathy or radiculopathy, cervical region: Secondary | ICD-10-CM | POA: Diagnosis not present

## 2021-12-09 DIAGNOSIS — S0990XA Unspecified injury of head, initial encounter: Secondary | ICD-10-CM

## 2021-12-09 DIAGNOSIS — M7989 Other specified soft tissue disorders: Secondary | ICD-10-CM | POA: Diagnosis not present

## 2021-12-09 DIAGNOSIS — R000 Tachycardia, unspecified: Secondary | ICD-10-CM | POA: Diagnosis not present

## 2021-12-09 DIAGNOSIS — R109 Unspecified abdominal pain: Secondary | ICD-10-CM | POA: Insufficient documentation

## 2021-12-09 DIAGNOSIS — M40204 Unspecified kyphosis, thoracic region: Secondary | ICD-10-CM | POA: Diagnosis not present

## 2021-12-09 DIAGNOSIS — Z043 Encounter for examination and observation following other accident: Secondary | ICD-10-CM | POA: Diagnosis not present

## 2021-12-09 DIAGNOSIS — S0181XA Laceration without foreign body of other part of head, initial encounter: Secondary | ICD-10-CM | POA: Diagnosis not present

## 2021-12-09 DIAGNOSIS — M25519 Pain in unspecified shoulder: Secondary | ICD-10-CM | POA: Diagnosis not present

## 2021-12-09 DIAGNOSIS — S3993XA Unspecified injury of pelvis, initial encounter: Secondary | ICD-10-CM | POA: Diagnosis not present

## 2021-12-09 DIAGNOSIS — I10 Essential (primary) hypertension: Secondary | ICD-10-CM | POA: Diagnosis not present

## 2021-12-09 DIAGNOSIS — Y990 Civilian activity done for income or pay: Secondary | ICD-10-CM | POA: Insufficient documentation

## 2021-12-09 DIAGNOSIS — S0101XA Laceration without foreign body of scalp, initial encounter: Secondary | ICD-10-CM | POA: Insufficient documentation

## 2021-12-09 DIAGNOSIS — W19XXXA Unspecified fall, initial encounter: Secondary | ICD-10-CM | POA: Diagnosis not present

## 2021-12-09 DIAGNOSIS — W132XXA Fall from, out of or through roof, initial encounter: Secondary | ICD-10-CM | POA: Insufficient documentation

## 2021-12-09 DIAGNOSIS — Z79899 Other long term (current) drug therapy: Secondary | ICD-10-CM | POA: Diagnosis not present

## 2021-12-09 DIAGNOSIS — M19011 Primary osteoarthritis, right shoulder: Secondary | ICD-10-CM | POA: Diagnosis not present

## 2021-12-09 DIAGNOSIS — S3991XA Unspecified injury of abdomen, initial encounter: Secondary | ICD-10-CM | POA: Diagnosis not present

## 2021-12-09 DIAGNOSIS — Z23 Encounter for immunization: Secondary | ICD-10-CM | POA: Diagnosis not present

## 2021-12-09 DIAGNOSIS — M47816 Spondylosis without myelopathy or radiculopathy, lumbar region: Secondary | ICD-10-CM | POA: Diagnosis not present

## 2021-12-09 DIAGNOSIS — J9811 Atelectasis: Secondary | ICD-10-CM | POA: Diagnosis not present

## 2021-12-09 DIAGNOSIS — Z743 Need for continuous supervision: Secondary | ICD-10-CM | POA: Diagnosis not present

## 2021-12-09 LAB — I-STAT CHEM 8, ED
BUN: 24 mg/dL — ABNORMAL HIGH (ref 8–23)
Calcium, Ion: 1.2 mmol/L (ref 1.15–1.40)
Chloride: 106 mmol/L (ref 98–111)
Creatinine, Ser: 1.5 mg/dL — ABNORMAL HIGH (ref 0.61–1.24)
Glucose, Bld: 217 mg/dL — ABNORMAL HIGH (ref 70–99)
HCT: 44 % (ref 39.0–52.0)
Hemoglobin: 15 g/dL (ref 13.0–17.0)
Potassium: 4.1 mmol/L (ref 3.5–5.1)
Sodium: 139 mmol/L (ref 135–145)
TCO2: 22 mmol/L (ref 22–32)

## 2021-12-09 LAB — CBC
HCT: 41.3 % (ref 39.0–52.0)
Hemoglobin: 14.2 g/dL (ref 13.0–17.0)
MCH: 33 pg (ref 26.0–34.0)
MCHC: 34.4 g/dL (ref 30.0–36.0)
MCV: 96 fL (ref 80.0–100.0)
Platelets: 190 10*3/uL (ref 150–400)
RBC: 4.3 MIL/uL (ref 4.22–5.81)
RDW: 13.3 % (ref 11.5–15.5)
WBC: 12 10*3/uL — ABNORMAL HIGH (ref 4.0–10.5)
nRBC: 0 % (ref 0.0–0.2)

## 2021-12-09 LAB — URINALYSIS, ROUTINE W REFLEX MICROSCOPIC
Bilirubin Urine: NEGATIVE
Glucose, UA: NEGATIVE mg/dL
Hgb urine dipstick: NEGATIVE
Ketones, ur: NEGATIVE mg/dL
Leukocytes,Ua: NEGATIVE
Nitrite: NEGATIVE
Protein, ur: NEGATIVE mg/dL
Specific Gravity, Urine: 1.034 — ABNORMAL HIGH (ref 1.005–1.030)
pH: 5 (ref 5.0–8.0)

## 2021-12-09 LAB — COMPREHENSIVE METABOLIC PANEL
ALT: 24 U/L (ref 0–44)
AST: 31 U/L (ref 15–41)
Albumin: 3.5 g/dL (ref 3.5–5.0)
Alkaline Phosphatase: 79 U/L (ref 38–126)
Anion gap: 6 (ref 5–15)
BUN: 21 mg/dL (ref 8–23)
CO2: 23 mmol/L (ref 22–32)
Calcium: 8.9 mg/dL (ref 8.9–10.3)
Chloride: 109 mmol/L (ref 98–111)
Creatinine, Ser: 1.51 mg/dL — ABNORMAL HIGH (ref 0.61–1.24)
GFR, Estimated: 46 mL/min — ABNORMAL LOW (ref >60)
Glucose, Bld: 215 mg/dL — ABNORMAL HIGH (ref 70–99)
Potassium: 4 mmol/L (ref 3.5–5.1)
Sodium: 138 mmol/L (ref 135–145)
Total Bilirubin: 0.4 mg/dL (ref 0.3–1.2)
Total Protein: 6.5 g/dL (ref 6.5–8.1)

## 2021-12-09 LAB — ETHANOL: Alcohol, Ethyl (B): <10 mg/dL (ref <10)

## 2021-12-09 LAB — LACTIC ACID, PLASMA: Lactic Acid, Venous: 2.3 mmol/L (ref 0.5–1.9)

## 2021-12-09 LAB — PROTIME-INR
INR: 1 (ref 0.8–1.2)
Prothrombin Time: 13 seconds (ref 11.4–15.2)

## 2021-12-09 LAB — TROPONIN I (HIGH SENSITIVITY): Troponin I (High Sensitivity): 19 ng/L — ABNORMAL HIGH (ref <18)

## 2021-12-09 LAB — SAMPLE TO BLOOD BANK

## 2021-12-09 MED ORDER — FENTANYL CITRATE PF 50 MCG/ML IJ SOSY
50.0000 ug | PREFILLED_SYRINGE | Freq: Once | INTRAMUSCULAR | Status: AC
  Administered 2021-12-09: 50 ug via INTRAVENOUS
  Filled 2021-12-09: qty 1

## 2021-12-09 MED ORDER — SODIUM CHLORIDE 0.9 % IV BOLUS
1000.0000 mL | Freq: Once | INTRAVENOUS | Status: AC
  Administered 2021-12-09: 1000 mL via INTRAVENOUS

## 2021-12-09 MED ORDER — TETANUS-DIPHTH-ACELL PERTUSSIS 5-2.5-18.5 LF-MCG/0.5 IM SUSY
0.5000 mL | PREFILLED_SYRINGE | Freq: Once | INTRAMUSCULAR | Status: AC
  Administered 2021-12-09: 0.5 mL via INTRAMUSCULAR
  Filled 2021-12-09: qty 0.5

## 2021-12-09 MED ORDER — LIDOCAINE HCL (PF) 1 % IJ SOLN
10.0000 mL | Freq: Once | INTRAMUSCULAR | Status: AC
  Administered 2021-12-09: 10 mL
  Filled 2021-12-09: qty 10

## 2021-12-09 MED ORDER — IOHEXOL 350 MG/ML SOLN
65.0000 mL | Freq: Once | INTRAVENOUS | Status: AC | PRN
  Administered 2021-12-09: 65 mL via INTRAVENOUS

## 2021-12-09 MED ORDER — MORPHINE SULFATE (PF) 4 MG/ML IV SOLN
4.0000 mg | Freq: Once | INTRAVENOUS | Status: AC
  Administered 2021-12-09: 4 mg via INTRAVENOUS
  Filled 2021-12-09: qty 1

## 2021-12-09 NOTE — ED Triage Notes (Signed)
Pt reports he was working in the attic when he fell out of the attic, negative LOC, no thinners, lac to forehead approx 4cm, skin tears to arms. Pt alert, oriented x4, appropriate.  VSS.

## 2021-12-09 NOTE — ED Notes (Signed)
Pt's wound bandaged with non-adherent dressing. Dried blood on pt's forehead and face also cleaned off

## 2021-12-09 NOTE — ED Notes (Signed)
Pt and family member educated on the caring for stitched and to keep them  clean and dry

## 2021-12-09 NOTE — ED Notes (Signed)
Suture cart and lidocaine at bedside. Wound was irrigated and wrapped by Previous Therapist, sports

## 2021-12-09 NOTE — Discharge Instructions (Addendum)
It was a pleasure taking care of you today. As dicussed, your images were all reassuring. You may have broken your L4/L5 transverse processes; however given you are not in pain, you do not need to wear a brace. You will need your sutures removed in 5 days.  We had a long discussion prior to discharge from the emergency department today about your persistently elevated heart rate.  I do have concerns about this elevated heart rate and I have asked you to stay in the hospital overnight for observation.  You have requested to go home and follow-up with your primary care physician, and although I cannot force you to stay in the hospital, this elevated heart rate may be a symptom of something serious and we need to watch it closely.  Is very important you follow-up with your primary care physician as soon as possible and return the emergency department if you have any worsening chest pain, shortness of breath, abdominal pain, weakness, fatigue or other concerning symptoms.

## 2021-12-09 NOTE — ED Provider Notes (Signed)
[facility] EMERGENCY DEPARTMENT Provider Note   CSN: [id number] Arrival date & time: 12/09/21  1402     History  Chief Complaint  Patient presents with   Fall   Head Injury    Kenneth Moore is a 81 y.o. male with a past medical history significant for hypertension, hyperlipidemia, and prediabetes who presents to the ED after a fall.  Patient was in the attic and fell through the ceiling to the next floor.  No LOC.  He is not currently on any blood thinners.  Patient admits to right shoulder and forearm pain.  He sustained a laceration to forehead.  Denies chest pain and shortness of breath.  No abdominal pain.  Denies back pain.  Patient was ambulatory after the fall and here in the ED  History obtained from patient and past medical records. No interpreter used during encounter.       Home Medications Prior to Admission medications   Medication Sig Start Date End Date Taking? Authorizing Provider  lisinopril (ZESTRIL) 10 MG tablet Take 1 tablet (10 mg total) by mouth daily. 10/27/21  Yes Sagardia, Ines Bloomer, MD  rosuvastatin (CRESTOR) 20 MG tablet Take 1 tablet (20 mg total) by mouth daily. 11/01/21  Yes Sagardia, Ines Bloomer, MD  naproxen (NAPROSYN) 500 MG tablet Take 1 tablet (500 mg total) by mouth 2 (two) times daily. Patient not taking: Reported on 08/18/2020 05/07/20   Volney American, PA-C      Allergies    Patient has no known allergies.    Review of Systems   Review of Systems  Respiratory:  Negative for shortness of breath.   Cardiovascular:  Negative for chest pain.  Gastrointestinal:  Negative for abdominal pain.  Musculoskeletal:  Positive for arthralgias. Negative for back pain and neck pain.  Neurological:  Negative for headaches.  All other systems reviewed and are negative.   Physical Exam Updated Vital Signs BP (!) 164/89   Pulse (!) 123   Temp 98.1 F (36.7 C) (Oral)   Resp 20   SpO2 97%  Physical Exam Vitals and nursing  note reviewed.  Constitutional:      General: He is not in acute distress.    Appearance: He is not ill-appearing.  HENT:     Head: Normocephalic.  Eyes:     Pupils: Pupils are equal, round, and reactive to light.  Neck:     Comments: No cervical midline tenderness. Cardiovascular:     Rate and Rhythm: Normal rate and regular rhythm.     Pulses: Normal pulses.     Heart sounds: Normal heart sounds. No murmur heard.    No friction rub. No gallop.  Pulmonary:     Effort: Pulmonary effort is normal.     Breath sounds: Normal breath sounds.  Abdominal:     General: Abdomen is flat. There is no distension.     Palpations: Abdomen is soft.     Tenderness: There is no abdominal tenderness. There is no guarding or rebound.  Musculoskeletal:        General: Normal range of motion.     Cervical back: Neck supple.     Comments: No thoracic or lumbar midline tenderness.  Tenderness throughout right shoulder.  Decreased range of motion due to pain. Radial pulse intact.   Skin:    General: Skin is warm and dry.     Comments: 4 cm laceration to left side of forehead  Neurological:     General:  No focal deficit present.     Mental Status: He is alert.     Comments: Speech is clear, able to follow commands CN III-XII intact Normal strength in upper and lower extremities bilaterally including dorsiflexion and plantar flexion, strong and equal grip strength Sensation grossly intact throughout Moves extremities without ataxia, coordination intact No pronator drift Ambulates without difficulty  Psychiatric:        Mood and Affect: Mood normal.        Behavior: Behavior normal.     ED Results / Procedures / Treatments   Labs (all labs ordered are listed, but only abnormal results are displayed) Labs Reviewed  COMPREHENSIVE METABOLIC PANEL - Abnormal; Notable for the following components:      Result Value   Glucose, Bld 215 (*)    Creatinine, Ser 1.51 (*)    GFR, Estimated 46 (*)     All other components within normal limits  CBC - Abnormal; Notable for the following components:   WBC 12.0 (*)    All other components within normal limits  LACTIC ACID, PLASMA - Abnormal; Notable for the following components:   Lactic Acid, Venous 2.3 (*)    All other components within normal limits  I-STAT CHEM 8, ED - Abnormal; Notable for the following components:   BUN 24 (*)    Creatinine, Ser 1.50 (*)    Glucose, Bld 217 (*)    All other components within normal limits  ETHANOL  PROTIME-INR  URINALYSIS, ROUTINE W REFLEX MICROSCOPIC  SAMPLE TO BLOOD BANK  TROPONIN I (HIGH SENSITIVITY)    EKG None  Radiology CT L-SPINE NO CHARGE  Result Date: 12/09/2021 CLINICAL DATA:  Fall.  Skin tears. EXAM: CT LUMBAR SPINE WITHOUT CONTRAST TECHNIQUE: Multidetector CT imaging of the lumbar spine was performed without intravenous contrast administration. Multiplanar CT image reconstructions were also generated. RADIATION DOSE REDUCTION: This exam was performed according to the departmental dose-optimization program which includes automated exposure control, adjustment of the mA and/or kV according to patient size and/or use of iterative reconstruction technique. COMPARISON:  None Available. FINDINGS: Segmentation: 5 non rib-bearing lumbar type vertebral bodies are present. The lowest fully formed vertebral body is L5. Alignment: Slight retrolisthesis present at L1-2 and L2-3. Leftward curvature is centered at L3. Rightward curvature is centered at L5. Vertebrae: Vertebral body heights are maintained. Nondisplaced right L4 and L5 transverse process fractures are present. Degenerative changes are present the SI joints bilaterally Paraspinal and other soft tissues: Unremarkable. Disc levels: L1-2: Insert normal disc L2-3: Mild broad-based disc protrusion is present. Mild bilateral foraminal narrowing is present. L3-4: A broad-based disc protrusion is present. Moderate central canals and right greater  than left foraminal stenosis is present. L4-5: A broad-based disc protrusion is present. Moderate facet hypertrophy is noted. Moderate central and bilateral foraminal stenosis is present, right greater than left. L5-S1: A broad-based disc protrusion is present. Moderate foraminal stenosis is worse on the right. IMPRESSION: 1. Nondisplaced right L4 and L5 transverse process fractures. 2. Multilevel spondylosis of the lumbar spine as described. 3. Mild bilateral foraminal narrowing at L2-3. 4. Moderate central and right greater than left foraminal stenosis at L3-4 and L4-5. 5. Moderate foraminal stenosis bilaterally at L5-S1 is worse on the right. These results were called by telephone at the time of interpretation on 12/09/2021 at 5:26 pm to Dr. Luciana Axeomer, who verbally acknowledged these results. Electronically Signed   By: Marin Robertshristopher  Mattern M.D.   On: 12/09/2021 17:32   CT T-SPINE NO CHARGE  Result Date: 12/09/2021 CLINICAL DATA:  Fall.  Level 2 trauma. EXAM: CT THORACIC SPINE WITHOUT CONTRAST TECHNIQUE: Multidetector CT images of the thoracic were obtained using the standard protocol without intravenous contrast. RADIATION DOSE REDUCTION: This exam was performed according to the departmental dose-optimization program which includes automated exposure control, adjustment of the mA and/or kV according to patient size and/or use of iterative reconstruction technique. COMPARISON:  None Available. FINDINGS: Alignment: No significant listhesis is present. Mild straightening of the normal thoracic kyphosis is present. Rightward curvature of the thoracic spine centered at T6-7. Vertebrae: Vertebral body heights are maintained. No acute fractures are present. Paraspinal and other soft tissues: Within normal limits. Disc levels: No focal osseous or definite soft tissue stenosis. IMPRESSION: 1. No acute fracture or traumatic subluxation. 2. Rightward curvature of the thoracic spine centered at T6-7. Electronically Signed    By: Marin Roberts M.D.   On: 12/09/2021 17:22   CT CHEST ABDOMEN PELVIS W CONTRAST  Result Date: 12/09/2021 CLINICAL DATA:  Level 2 trauma. Fall. Skin tears to the upper extremities. EXAM: CT CHEST, ABDOMEN, AND PELVIS WITH CONTRAST TECHNIQUE: Multidetector CT imaging of the chest, abdomen and pelvis was performed following the standard protocol during bolus administration of intravenous contrast. RADIATION DOSE REDUCTION: This exam was performed according to the departmental dose-optimization program which includes automated exposure control, adjustment of the mA and/or kV according to patient size and/or use of iterative reconstruction technique. CONTRAST:  83mL OMNIPAQUE IOHEXOL 350 MG/ML SOLN COMPARISON:  None Available. FINDINGS: CT CHEST FINDINGS Cardiovascular: Heart is upper limits of normal for size. No significant pericardial effusion is present. Aortic arch and great vessel origins are within normal limits. Descending thoracic aorta is within normal limits. Pulmonary arteries are normal. Mediastinum/Nodes: No enlarged mediastinal, hilar, or axillary lymph nodes. Thyroid gland, trachea, and esophagus demonstrate no significant findings. Lungs/Pleura: Mild dependent atelectasis is present. No nodule or mass lesion is present. No focal contusion, pneumothorax, or significant pleural effusion is present. Musculoskeletal: Vertebral body heights are maintained. No acute fractures are present. No focal osseous lesions are present. Mild rightward curvature is centered at T6. CT ABDOMEN PELVIS FINDINGS Hepatobiliary: No hepatic injury or perihepatic hematoma. Gallbladder is unremarkable. Pancreas: Unremarkable. No pancreatic ductal dilatation or surrounding inflammatory changes. Spleen: No splenic injury or perisplenic hematoma. Adrenals/Urinary Tract: No adrenal hemorrhage or renal injury identified. Bladder is unremarkable. Stomach/Bowel: Stomach and duodenum are within normal limits. Small bowel is  unremarkable. Terminal ileum is within normal limits. Appendix is visualized and normal. The ascending and transverse colon are within normal limits. The descending and sigmoid colon are normal. Vascular/Lymphatic: No significant vascular findings are present. No enlarged abdominal or pelvic lymph nodes. Reproductive: Prostate is unremarkable. Other: No abdominal wall hernia or abnormality. No abdominopelvic ascites. Musculoskeletal: Mild degenerative changes are present in the lower lumbar spine, asymmetric on the right at L3-4 and on the left at L4-5. Vertebral body heights are maintained. No acute fractures are present. IMPRESSION: 1. No acute trauma to the chest, abdomen, or pelvis. 2. Mild degenerative changes of the lower lumbar spine. Electronically Signed   By: Marin Roberts M.D.   On: 12/09/2021 17:14   CT HEAD WO CONTRAST  Result Date: 12/09/2021 CLINICAL DATA:  Level 2 trauma, fell, head injury, forehead laceration, fell out of attic EXAM: CT HEAD WITHOUT CONTRAST CT CERVICAL SPINE WITHOUT CONTRAST TECHNIQUE: Multidetector CT imaging of the head and cervical spine was performed following the standard protocol without intravenous contrast. Multiplanar CT image reconstructions  of the cervical spine were also generated. RADIATION DOSE REDUCTION: This exam was performed according to the departmental dose-optimization program which includes automated exposure control, adjustment of the mA and/or kV according to patient size and/or use of iterative reconstruction technique. COMPARISON:  None Available. FINDINGS: CT HEAD FINDINGS Brain: No acute infarct or hemorrhage. Lateral ventricles and midline structures are unremarkable. No acute extra-axial fluid collections. No mass effect. Vascular: No hyperdense vessel or unexpected calcification. Skull: Left frontal scalp laceration. No underlying fracture. The remainder of the calvarium is unremarkable. Sinuses/Orbits: No acute finding. Other: None. CT  CERVICAL SPINE FINDINGS Alignment: Slight reversal cervical lordosis likely due to multilevel spondylosis and facet hypertrophy. Mild S shaped scoliosis, left convex at the cervicothoracic junction. Skull base and vertebrae: No acute fracture. No primary bone lesion or focal pathologic process. Soft tissues and spinal canal: No prevertebral fluid or swelling. No visible canal hematoma. Disc levels: Prominent spondylosis at C5-6 and C6-7. Multilevel facet hypertrophy, most pronounced on the left from C2-3 through C5-6. Upper chest: Airway is patent.  Lung apices are clear. Other: Reconstructed images demonstrate no additional findings. IMPRESSION: 1. Left frontal scalp laceration.  No underlying fracture. 2. Otherwise no acute intracranial process. 3. No acute cervical spine fracture. 4. Multilevel cervical spondylosis and facet hypertrophy as above, resulting in S shaped scoliosis and reversal of cervical lordosis. Electronically Signed   By: Sharlet Salina M.D.   On: 12/09/2021 17:09   CT CERVICAL SPINE WO CONTRAST  Result Date: 12/09/2021 CLINICAL DATA:  Level 2 trauma, fell, head injury, forehead laceration, fell out of attic EXAM: CT HEAD WITHOUT CONTRAST CT CERVICAL SPINE WITHOUT CONTRAST TECHNIQUE: Multidetector CT imaging of the head and cervical spine was performed following the standard protocol without intravenous contrast. Multiplanar CT image reconstructions of the cervical spine were also generated. RADIATION DOSE REDUCTION: This exam was performed according to the departmental dose-optimization program which includes automated exposure control, adjustment of the mA and/or kV according to patient size and/or use of iterative reconstruction technique. COMPARISON:  None Available. FINDINGS: CT HEAD FINDINGS Brain: No acute infarct or hemorrhage. Lateral ventricles and midline structures are unremarkable. No acute extra-axial fluid collections. No mass effect. Vascular: No hyperdense vessel or  unexpected calcification. Skull: Left frontal scalp laceration. No underlying fracture. The remainder of the calvarium is unremarkable. Sinuses/Orbits: No acute finding. Other: None. CT CERVICAL SPINE FINDINGS Alignment: Slight reversal cervical lordosis likely due to multilevel spondylosis and facet hypertrophy. Mild S shaped scoliosis, left convex at the cervicothoracic junction. Skull base and vertebrae: No acute fracture. No primary bone lesion or focal pathologic process. Soft tissues and spinal canal: No prevertebral fluid or swelling. No visible canal hematoma. Disc levels: Prominent spondylosis at C5-6 and C6-7. Multilevel facet hypertrophy, most pronounced on the left from C2-3 through C5-6. Upper chest: Airway is patent.  Lung apices are clear. Other: Reconstructed images demonstrate no additional findings. IMPRESSION: 1. Left frontal scalp laceration.  No underlying fracture. 2. Otherwise no acute intracranial process. 3. No acute cervical spine fracture. 4. Multilevel cervical spondylosis and facet hypertrophy as above, resulting in S shaped scoliosis and reversal of cervical lordosis. Electronically Signed   By: Sharlet Salina M.D.   On: 12/09/2021 17:09   DG Forearm Right  Result Date: 12/09/2021 CLINICAL DATA:  Larey Seat out of attic, pain EXAM: RIGHT FOREARM - 2 VIEW COMPARISON:  None Available. FINDINGS: Frontal and lateral views of the right forearm are obtained. No acute fracture. Alignment of the right wrist and  elbow is anatomic. Joint spaces are well preserved. There is soft tissue swelling of the proximal forearm. IMPRESSION: 1. Soft tissue swelling proximal forearm.  No acute fracture. Electronically Signed   By: Sharlet Salina M.D.   On: 12/09/2021 15:43   DG Shoulder Right  Result Date: 12/09/2021 CLINICAL DATA:  Larey Seat out of attic, pain EXAM: RIGHT SHOULDER - 2+ VIEW COMPARISON:  11/07/2016 FINDINGS: Internal rotation, external rotation, and transscapular views of the right shoulder  are obtained. Moderate degenerative changes of the acromioclavicular and glenohumeral joints are stable. No evidence of acute fracture, subluxation, or dislocation. Soft tissues are unremarkable. The right chest is clear. IMPRESSION: 1. Stable degenerative changes.  No acute fracture. Electronically Signed   By: Sharlet Salina M.D.   On: 12/09/2021 15:42    Procedures .Marland KitchenLaceration Repair  Date/Time: 12/09/2021 6:16 PM  Performed by: Mannie Stabile, PA-C Authorized by: Mannie Stabile, PA-C   Consent:    Consent obtained:  Verbal   Consent given by:  Patient   Risks, benefits, and alternatives were discussed: yes     Risks discussed:  Infection, pain, poor cosmetic result and poor wound healing Universal protocol:    Procedure explained and questions answered to patient or proxy's satisfaction: yes     Relevant documents present and verified: yes     Test results available: yes     Imaging studies available: yes     Required blood products, implants, devices, and special equipment available: yes     Site/side marked: yes     Immediately prior to procedure, a time out was called: yes     Patient identity confirmed:  Verbally with patient Anesthesia:    Anesthesia method:  Local infiltration   Local anesthetic:  Lidocaine 1% w/o epi Laceration details:    Location:  Face   Face location:  Forehead   Length (cm):  4   Depth (mm):  3 Pre-procedure details:    Preparation:  Patient was prepped and draped in usual sterile fashion and imaging obtained to evaluate for foreign bodies Exploration:    Limited defect created (wound extended): no     Hemostasis achieved with:  Direct pressure   Imaging outcome: foreign body not noted     Wound exploration: wound explored through full range of motion and entire depth of wound visualized     Wound extent: no foreign body and no underlying fracture     Contaminated: no   Treatment:    Area cleansed with:  Saline   Amount of  cleaning:  Standard   Irrigation solution:  Sterile saline   Irrigation volume:  40   Irrigation method:  Syringe   Visualized foreign bodies/material removed: no     Debridement:  None   Undermining:  None   Scar revision: no   Skin repair:    Repair method:  Sutures   Suture size:  5-0   Suture material:  Prolene   Suture technique:  Simple interrupted   Number of sutures:  8 Approximation:    Approximation:  Close Repair type:    Repair type:  Intermediate Post-procedure details:    Dressing:  Non-adherent dressing   Procedure completion:  Tolerated well, no immediate complications     Medications Ordered in ED Medications  sodium chloride 0.9 % bolus 1,000 mL (has no administration in time range)  Tdap (BOOSTRIX) injection 0.5 mL (0.5 mLs Intramuscular Given 12/09/21 1501)  fentaNYL (SUBLIMAZE) injection 50 mcg (50 mcg Intravenous  Given 12/09/21 1503)  iohexol (OMNIPAQUE) 350 MG/ML injection 65 mL (65 mLs Intravenous Contrast Given 12/09/21 1636)  lidocaine (PF) (XYLOCAINE) 1 % injection 10 mL (10 mLs Infiltration Given by Other 12/09/21 1735)  sodium chloride 0.9 % bolus 1,000 mL (1,000 mLs Intravenous New Bag/Given 12/09/21 1734)  morphine (PF) 4 MG/ML injection 4 mg (4 mg Intravenous Given 12/09/21 1744)    ED Course/ Medical Decision Making/ A&P Clinical Course as of 12/09/21 1833  Fri Dec 09, 2021  1724 Lactic Acid, Venous(!!): 2.3 [CA]  1728 L4-L5 [MK]  1810 Glucose(!): 215 [CA]  1810 Creatinine(!): 1.51 [CA]    Clinical Course User Index [CA] Mannie Stabile, PA-C [MK] Kommor, [place], MD                           Medical Decision Making Amount and/or Complexity of Data Reviewed Independent Historian: EMS    Details: EMS provided history. Labs: ordered. Decision-making details documented in ED Course. Radiology: ordered and independent interpretation performed. Decision-making details documented in ED Course.  Risk Prescription drug  management.   Official [place] interpreter used during entire encounter due to language barrier.  This patient presents to the ED for concern of fall, this involves an extensive number of treatment options, and is a complaint that carries with it a high risk of complications and morbidity.  The differential diagnosis includes bony fractures, intracranial hemorrhage, laceration, etc  81 year old male presents to the ED after falling through the attic onto the next floor.  No LOC.  Is not currently on blood thinners. History of HTN, prediabetes, and hyperlipidemia.  Upon arrival, patient afebrile, tachycardic at 112 with otherwise reassuring vitals.  Patient in no acute distress.  4 cm laceration to left side of forehead.  No cervical, thoracic, or lumbar midline tenderness.  No signs of basilar skull fracture.  CT scans ordered.  Trauma labs ordered.  IV fentanyl given.  CT images personally reviewed and interpreted which are negative for bony fracture or traumatic injuries except L4/L5 TP fractures. Patient has no pain on palpation. No brace needed at this time. Bilateral lower extremities neurovascularly intact. Low suspicion for cauda equina or central cord compression. Low suspicion for any other emergent injuries given negative CT scans. X-rays negative for bony fractures. Lactic acid elevated likely due to stress reaction. CMP significant for hyperglycemia at 215. No anion gap. Doubt DKA. Elevated creatinine at 1.51. IVFs given.   Laceration repair as noted above. Patient continues to be tachycardic upon reassessment after IVFs, will give more IVFs and reassess.Troponin added. Patient denies any pain.   Patient handed off to Dr. Posey Rea at shift change pending HR improvement and troponin. If HR improves, patient may be discharged home.        Final Clinical Impression(s) / ED Diagnoses Final diagnoses:  Injury of head, initial encounter  Laceration of scalp, initial encounter  Fall,  initial encounter    Rx / DC Orders ED Discharge Orders     None         Jesusita Oka 12/09/21 Donzetta Kohut, MD 12/09/21 2054

## 2021-12-14 ENCOUNTER — Ambulatory Visit (INDEPENDENT_AMBULATORY_CARE_PROVIDER_SITE_OTHER): Payer: Medicare HMO | Admitting: Emergency Medicine

## 2021-12-14 ENCOUNTER — Encounter: Payer: Self-pay | Admitting: Emergency Medicine

## 2021-12-14 VITALS — BP 122/84 | HR 104 | Temp 98.3°F | Ht 62.0 in | Wt 121.1 lb

## 2021-12-14 DIAGNOSIS — S0181XA Laceration without foreign body of other part of head, initial encounter: Secondary | ICD-10-CM | POA: Diagnosis not present

## 2021-12-14 NOTE — Assessment & Plan Note (Signed)
Slowly healing.  No signs of infection. Sutures intact but not ready to be removed yet. Suture removal in 4 to 5 days. Continue wound care as instructed. Keep open to air.

## 2021-12-14 NOTE — Patient Instructions (Signed)
[name] v?t th??ng ? khu [facility] Care Ch? ph?u thu?t l cc m?i khu c th? ???c s? d?ng ?? ?ng v?t th??ng. Ch? ph?u thu?t lm b?ng nhi?u ch?t li?u khc nhau. Cc lo?i ch? ny c th? phn h?y khi v?t th??ng c?a qu v? lnh l?i (t? tiu), ho?c c th? c?n ph?i lo?i b? (khng t? tiu). Ch?m [name] v?t th??ng ?ng cch c th? gip ng?n ng?a ?au v nhi?m trng. Vi?c ny c?ng c th? gip v?t th??ng c?a qu v? lnh l?i nhanh h?n. Tun th? ch? d?n c?a chuyn gia ch?m [name] s?c kh?e v? cch ch?m [name] v?t th??ng ? khu. Cc v?t d?ng c?n thi?t: X phng v n??c. Kh?n kh, s?ch. Dung d?ch r?a v?t th??ng ho?c n??c mu?i sinh l, n?u c?n. B?ng (b?ng g?c) s?ch, n?u c?n. Thu?c m? khng sinh, n?u ???c chuyn gia ch?m [place] s?c kh?e ch? d?n. Ch?m [place] v?t th??ng ? khu nh? th? no  Gi? cho v?t th??ng hon ton kh trong 24 gi? ??u tin ho?c trong th?i gian theo ch? d?n c?a chuyn gia ch?m [name] s?c kh?e c?a qu v?. Sau 24-48 gi?, qu v? c th? t?m vi sen ho?c t?m b?n theo ch? d?n c?a chuyn gia ch?m [name] s?c kh?e. Khng ngm ho?c nhng v?t th??ng trong n??c cho ??n khi ? tho ch?. Sau 24 gi? ??u tin, lm s?ch v?t th??ng m?i ngy m?t l?n ho?c v?i t?n su?t theo ch? d?n c?a chuyn gia ch?m [place] s?c kh?e. Lm theo cc b??c sau: R?a v x? v?t th??ng theo ch? d?n c?a chuyn gia ch?m [name] s?c kh?e. Ch?m kh v?t th??ng b?ng kh?n s?ch. Khng ch xt v?t th??ng. Sau khi lm s?ch v?t th??ng, bi m?t l?p thu?c m? khng sinh m?ng theo ch? d?n c?a chuyn gia ch?m [name] s?c kh?e. Vi?c ny s? ng?n ng?a nhi?m trng v gi? cho mi?ng b?ng khng b? dnh vo v?t th??ng. Tun th? ch? d?n c?a chuyn gia ch?m [name] s?c kh?e v? cch thay b?ng. ??m b?o qu v?: R?a tay b?ng x phng v n??c trong t nh?t 20 giy tr??c v sau khi qu v? thay b?ng. N?u khng c x phng v n??c, hy dng dung d?ch st trng tay. Thay b?ng t nh?t m?i ngy m?t l?n ho?c theo t?n su?t m chuyn gia ch?m [name] s?c kh?e ch? d?n. N?u b?ng b? ??t ho?c b?n, hy thay  b?ng. Gi? nguyn v? tr c?a ch? ph?u thu?t v b?ng dnh da khc, ch?ng h?n nh? b?ng dn ho?c keo dn da. B?ng dnh da ny c th? c?n ph?i ?? nguyn t?i ch? trong 2 tu?n ho?c lu h?n. N?u cc mp d?i b?ng dnh b?t ??u long ra v cong ln, qu v? c th? c?t nh?ng ch? mp b? long ra ?Kenneth Moore bc h?t cc d?i b?ng dnh ra tr? khi chuyn gia ch?m [name] s?c kh?e b?o qu v? lm nh? v?y. Ki?m tra v?t th??ng m?i ngy xem c cc d?u hi?u nhi?m trng hay khng. Theo di xem c: ??, s?ng ho?c ?au. Di?ch ho?c mu. ?m nng, pht ban ho?c tnh tr?ng c?ng m?i c t?i v? tr v?t th??ng. M? ho?c mi hi. Nh? tho ch? ph?u thu?t theo ch? d?n c?a chuyn gia ch?m [name] s?c kh?e c?a qu v?. Tun th? nh?ng h??ng d?n ny ? nh: Thu?c Ch? dng ho?c ch? bi thu?c khng k ??n v thu?c k ??n theo ch? d?n c?a chuyn gia ch?m  s?c kh?e. N?u  qu v? ???c k thu?c khng sinh ho?c thu?c m? khng sinh, hy bi ho?c dng thu?c theo ch? d?n c?a chuyn gia ch?m [name] s?c kh?e. Khng d?ng s? d?ng thu?c khng sinh ngay c? khi tnh tr?ng c?a qu v? c?i thi?n. H??ng d?n chung ?? gip gi?m hnh thnh s?o sau khi v?t th??ng lnh, hy m?c qu?n o che v?t th??ng ho?c bi kem ch?ng n?ng c SPF t nh?t 30 b?t c? khi no qu v? ?i ra ngoi. Khng gi ho?c ch?c vo v?t th??ng. Trnh ko dn v?t th??ng. Nng (nng cao) vng b? th??ng ln cao h?n tim khi qu v? ng?i ho?c n?m, n?u c th?. ?n ch? ?? ?n bao g?m protein, vitamin A v vitamin C ?? gip v?t th??ng lnh l?i. U?ng ?? n??c ?? gi? cho n??c ti?u c mu vng nh?t. Tun th? theo t?t c? cc l?n khm l?i. ?i?u ny c vai tr quan tr?ng. Hy lin l?c v?i chuyn gia ch?m [name] s?c kh?e n?u: Qu v? ? ???c tim phng u?n vn v qu v? b? s?ng, ?au nhi?u, ??, ho?c ch?y mu ? v? tr tim. V?t th??ng c?a qu v? h mi?ng ho?c qu v? th?y c g ? l? ra kh?i v?t th??ng, ch?ng h?n nh? g? ho?c th?y tinh. Qu v? c b?t k? d?u hi?u nhi?m trng no trong s? cc d?u hi?u sau ?y: T?y ??, s?ng n? ho?c ?au xung  quanh v?t th??ng. D?ch ho?c mu ch?y ra ? v?t th??ng. ?m nng, pht ban ho?c tnh tr?ng c?ng m?i c xung quanh v?t th??ng. S?t. Da g?n v?t th??ng ??i mu. Quy? vi? bi? ?au khng ??? sau khi du?ng thu?c. Qu v? b? t b xung quanh v?t th??ng. Yu c?u tr? gip ngay l?p t?c n?u: Qu v? b? s?ng r?t nhi?u ho?c ?au h?n quanh v?t th??ng. Qu v? th?y m? ho?c mi hi thot ra ? v?t th??ng c?a qu v?. Qu v? c cc c?c gy ?au ??n g?n v?t th??ng ho?c b?t c? ch? no trn c? th?. Qu v? c m?t v?t ?? ? v?t th??ng lan ra. V?t th??ng ? trn bn tay ho?c bn chn v: Cc ngn tay ho?c ngn chn c?a qu v? b? nh?t nh?t ho?c h?i xanh. Qu v? khng th? c? ??ng ngn tay ho?c ngn chn bnh th??ng. Qu v? b? t lan xu?ng bn tay, bn chn, ngn tay ho?c ngn chn. Tm t?t Ch? ph?u thu?t l cc m?i khu c th? ???c s? d?ng ?? ?ng v?t th??ng. Ch?m [place] v?t th??ng ?ng cch c th? gip ng?n ng?a ?au v nhi?m trng. Gi? cho v?t th??ng hon ton kh trong 24 gi? ??u tin ho?c trong th?i gian theo ch? d?n c?a chuyn gia ch?m [place] s?c kh?e c?a qu v?. Sau 24-48 gi?, qu v? c th? t?m vi sen ho?c t?m b?n theo ch? d?n c?a chuyn gia ch?m [place] s?c kh?e. ?? gip lnh v?t th??ng, hy ?n nh?ng th?c ph?m giu protein, vitamin A v vitamin C. Thng tin ny khng nh?m m?c ?ch thay th? cho l?i khuyn m chuyn gia ch?m [name] s?c kh?e ni v?i qu v?. Hy b?o ??m qu v? ph?i th?o lu?n b?t k? v?n ?? g m qu v? c v?i chuyn gia ch?m  s?c kh?e c?a qu v?. Document Revised: 08/06/2020 Document Reviewed: 08/06/2020 Elsevier Patient Education  [name].

## 2021-12-14 NOTE — Progress Notes (Signed)
Kenneth Moore 81 y.o.   Chief Complaint  Patient presents with   Follow-up    Patient fell, has sutures on forehead, suture removal     HISTORY OF PRESENT ILLNESS: This is a 81 y.o. male sustained forehead laceration requiring sutures placed 12/09/2021.  Here for possible removal. Overall doing well.  No complications.  Still sporting original dressing from emergency department visit.  HPI   Prior to Admission medications   Medication Sig Start Date End Date Taking? Authorizing Provider  lisinopril (ZESTRIL) 10 MG tablet Take 1 tablet (10 mg total) by mouth daily. 10/27/21  Yes Marelyn Rouser, Ines Bloomer, MD  rosuvastatin (CRESTOR) 20 MG tablet Take 1 tablet (20 mg total) by mouth daily. 11/01/21  Yes Jaelyne Deeg, Ines Bloomer, MD  naproxen (NAPROSYN) 500 MG tablet Take 1 tablet (500 mg total) by mouth 2 (two) times daily. Patient not taking: Reported on 12/14/2021 05/07/20   Volney American, PA-C    No Known Allergies  Patient Active Problem List   Diagnosis Date Noted   Hypertriglyceridemia 04/28/2021   Dyslipidemia 01/02/2019   Prediabetes 01/02/2019   Essential hypertension 06/24/2018   Arthritis, shoulder region 11/07/2016    Past Medical History:  Diagnosis Date   Hypertension     No past surgical history on file.  Social History   Socioeconomic History   Marital status: Married    Spouse name: Not on file   Number of children: 3   Years of education: Not on file   Highest education level: Not on file  Occupational History   Not on file  Tobacco Use   Smoking status: Never   Smokeless tobacco: Never  Vaping Use   Vaping Use: Never used  Substance and Sexual Activity   Alcohol use: No   Drug use: Not Currently   Sexual activity: Not Currently  Other Topics Concern   Not on file  Social History Narrative   Not on file   Social Determinants of Health   Financial Resource Strain: Not on file  Food Insecurity: Not on file  Transportation Needs: Not on  file  Physical Activity: Not on file  Stress: Not on file  Social Connections: Not on file  Intimate Partner Violence: Not on file    No family history on file.   Review of Systems  Constitutional: Negative.  Negative for chills and fever.  HENT:  Negative for congestion and sore throat.   Respiratory: Negative.  Negative for cough and shortness of breath.   Gastrointestinal:  Negative for nausea and vomiting.  Skin:        Forehead laceration  Neurological:  Negative for dizziness and headaches.  All other systems reviewed and are negative.  Vitals:   12/14/21 1536  BP: 122/84  Pulse: (!) 104  Temp: 98.3 F (36.8 C)  SpO2: 96%      Physical Exam Constitutional:      Appearance: Normal appearance.  HENT:     Head: Normocephalic.  Eyes:     Extraocular Movements: Extraocular movements intact.     Pupils: Pupils are equal, round, and reactive to light.  Cardiovascular:     Rate and Rhythm: Normal rate.  Pulmonary:     Effort: Pulmonary effort is normal.  Skin:    General: Skin is warm and dry.     Comments: Forehead laceration healing well.  Tissues still very frail and tender and wet.  Sutures intact.  Not ready for removal.  Neurological:     Mental  Status: He is alert and oriented to person, place, and time.  Psychiatric:        Mood and Affect: Mood normal.        Behavior: Behavior normal.      ASSESSMENT & PLAN: A total of 34 minutes was spent with the patient and counseling/coordination of care regarding preparing for this visit, review of most recent office visit notes, review of most recent emergency department visit notes, inspection and evaluation of forehead laceration, management and treatment, wound care instructions, need for suture removal in 4 to 5 days, prognosis, documentation, and need for follow-up.  Problem List Items Addressed This Visit       Other   Laceration of forehead - Primary    Slowly healing.  No signs of  infection. Sutures intact but not ready to be removed yet. Suture removal in 4 to 5 days. Continue wound care as instructed. Keep open to air.      Patient Instructions  Ch?m [place] v?t th??ng ? khu Sutured Wound Care Ch? ph?u thu?t l cc m?i khu c th? ???c s? d?ng ?? ?ng v?t th??ng. Ch? ph?u thu?t lm b?ng nhi?u ch?t li?u khc nhau. Cc lo?i ch? ny c th? phn h?y khi v?t th??ng c?a qu v? lnh l?i (t? tiu), ho?c c th? c?n ph?i lo?i b? (khng t? tiu). Ch?m [place] v?t th??ng ?ng cch c th? gip ng?n ng?a ?au v nhi?m trng. Vi?c ny c?ng c th? gip v?t th??ng c?a qu v? lnh l?i nhanh h?n. Tun th? ch? d?n c?a chuyn gia ch?m [place] s?c kh?e v? cch ch?m [place] v?t th??ng ? khu. Cc v?t d?ng c?n thi?t: X phng v n??c. Kh?n kh, s?ch. Dung d?ch r?a v?t th??ng ho?c n??c mu?i sinh l, n?u c?n. B?ng (b?ng g?c) s?ch, n?u c?n. Thu?c m? khng sinh, n?u ???c chuyn gia ch?m [place] s?c kh?e ch? d?n. Ch?m [place] v?t th??ng ? khu nh? th? no  Gi? cho v?t th??ng hon ton kh trong 24 gi? ??u tin ho?c trong th?i gian theo ch? d?n c?a chuyn gia ch?m [place] s?c kh?e c?a qu v?. Sau 24-48 gi?, qu v? c th? t?m vi sen ho?c t?m b?n theo ch? d?n c?a chuyn gia ch?m [place] s?c kh?e. Khng ngm ho?c nhng v?t th??ng trong n??c cho ??n khi ? tho ch?. Sau 24 gi? ??u tin, lm s?ch v?t th??ng m?i ngy m?t l?n ho?c v?i t?n su?t theo ch? d?n c?a chuyn gia ch?m [place] s?c kh?e. Lm theo cc b??c sau: R?a v x? v?t th??ng theo ch? d?n c?a chuyn gia ch?m [place] s?c kh?e. Ch?m kh v?t th??ng b?ng kh?n s?ch. Khng ch xt v?t th??ng. Sau khi lm s?ch v?t th??ng, bi m?t l?p thu?c m? khng sinh m?ng theo ch? d?n c?a chuyn gia ch?m [place] s?c kh?e. Vi?c ny s? ng?n ng?a nhi?m trng v gi? cho mi?ng b?ng khng b? dnh vo v?t th??ng. Tun th? ch? d?n c?a chuyn gia ch?m [place] s?c kh?e v? cch thay b?ng. ??m b?o qu v?: R?a tay b?ng x phng v n??c trong t nh?t 20 giy tr??c v sau khi qu v? thay b?ng. N?u khng c x phng v  n??c, hy dng dung d?ch st trng tay. Thay b?ng t nh?t m?i ngy m?t l?n ho?c theo t?n su?t m chuyn gia ch?m [place] s?c kh?e ch? d?n. N?u b?ng b? ??t ho?c b?n, hy thay b?ng. Gi? nguyn v? tr c?a ch? ph?u thu?t v b?ng dnh da khc, ch?ng  h?n nh? b?ng dn ho?c keo dn da. B?ng dnh da ny c th? c?n ph?i ?? nguyn t?i ch? trong 2 tu?n ho?c lu h?n. N?u cc mp d?i b?ng dnh b?t ??u long ra v cong ln, qu v? c th? c?t nh?ng ch? mp b? long ra ?Imagene Sheller bc h?t cc d?i b?ng dnh ra tr? khi chuyn gia ch?m [place] s?c kh?e b?o qu v? lm nh? v?y. Ki?m tra v?t th??ng m?i ngy xem c cc d?u hi?u nhi?m trng hay khng. Theo di xem c: ??, s?ng ho?c ?au. Di?ch ho?c mu. ?m nng, pht ban ho?c tnh tr?ng c?ng m?i c t?i v? tr v?t th??ng. M? ho?c mi hi. Nh? tho ch? ph?u thu?t theo ch? d?n c?a chuyn gia ch?m [place] s?c kh?e c?a qu v?. Tun th? nh?ng h??ng d?n ny ? nh: Thu?c Ch? dng ho?c ch? bi thu?c khng k ??n v thu?c k ??n theo ch? d?n c?a chuyn gia ch?m [place] s?c kh?e. N?u qu v? ???c k thu?c khng sinh ho?c thu?c m? khng sinh, hy bi ho?c dng thu?c theo ch? d?n c?a chuyn gia ch?m [place] s?c kh?e. Khng d?ng s? d?ng thu?c khng sinh ngay c? khi tnh tr?ng c?a qu v? c?i thi?n. H??ng d?n chung ?? gip gi?m hnh thnh s?o sau khi v?t th??ng lnh, hy m?c qu?n o che v?t th??ng ho?c bi kem ch?ng n?ng c SPF t nh?t 30 b?t c? khi no qu v? ?i ra ngoi. Khng gi ho?c ch?c vo v?t th??ng. Trnh ko dn v?t th??ng. Nng (nng cao) vng b? th??ng ln cao h?n tim khi qu v? ng?i ho?c n?m, n?u c th?. ?n ch? ?? ?n bao g?m protein, vitamin A v vitamin C ?? gip v?t th??ng lnh l?i. U?ng ?? n??c ?? gi? cho n??c ti?u c mu vng nh?t. Tun th? theo t?t c? cc l?n khm l?i. ?i?u ny c vai tr quan tr?ng. Hy lin l?c v?i chuyn gia ch?m [place] s?c kh?e n?u: Qu v? ? ???c tim phng u?n vn v qu v? b? s?ng, ?au nhi?u, ??, ho?c ch?y mu ? v? tr tim. V?t th??ng c?a qu v? h mi?ng ho?c qu v?  th?y c g ? l? ra kh?i v?t th??ng, ch?ng h?n nh? g? ho?c th?y tinh. Qu v? c b?t k? d?u hi?u nhi?m trng no trong s? cc d?u hi?u sau ?y: T?y ??, s?ng n? ho?c ?au xung quanh v?t th??ng. D?ch ho?c mu ch?y ra ? v?t th??ng. ?m nng, pht ban ho?c tnh tr?ng c?ng m?i c xung quanh v?t th??ng. S?t. Da g?n v?t th??ng ??i mu. Quy? vi? bi? ?au khng ??? sau khi du?ng thu?c. Qu v? b? t b xung quanh v?t th??ng. Yu c?u tr? gip ngay l?p t?c n?u: Qu v? b? s?ng r?t nhi?u ho?c ?au h?n quanh v?t th??ng. Qu v? th?y m? ho?c mi hi thot ra ? v?t th??ng c?a qu v?. Qu v? c cc c?c gy ?au ??n g?n v?t th??ng ho?c b?t c? ch? no trn c? th?. Qu v? c m?t v?t ?? ? v?t th??ng lan ra. V?t th??ng ? trn bn tay ho?c bn chn v: Cc ngn tay ho?c ngn chn c?a qu v? b? nh?t nh?t ho?c h?i xanh. Qu v? khng th? c? ??ng ngn tay ho?c ngn chn bnh th??ng. Qu v? b? t lan xu?ng bn tay, bn chn, ngn tay ho?c ngn chn. Tm t?t Ch? ph?u thu?t l cc m?i khu c th? ???c s? d?ng ?? ?ng  v?t th??ng. Ch?m [place] v?t th??ng ?ng cch c th? gip ng?n ng?a ?au v nhi?m trng. Gi? cho v?t th??ng hon ton kh trong 24 gi? ??u tin ho?c trong th?i gian theo ch? d?n c?a chuyn gia ch?m [place] s?c kh?e c?a qu v?. Sau 24-48 gi?, qu v? c th? t?m vi sen ho?c t?m b?n theo ch? d?n c?a chuyn gia ch?m [place] s?c kh?e. ?? gip lnh v?t th??ng, hy ?n nh?ng th?c ph?m giu protein, vitamin A v vitamin C. Thng tin ny khng nh?m m?c ?ch thay th? cho l?i khuyn m chuyn gia ch?m [place] s?c kh?e ni v?i qu v?. Hy b?o ??m qu v? ph?i th?o lu?n b?t k? v?n ?? g m qu v? c v?i chuyn gia ch?m [place] s?c kh?e c?a qu v?. Document Revised: 08/06/2020 Document Reviewed: 08/06/2020 Elsevier Patient Education  2023 Elsevier Inc.    Edwina Barth, MD [facility]

## 2021-12-20 ENCOUNTER — Ambulatory Visit (INDEPENDENT_AMBULATORY_CARE_PROVIDER_SITE_OTHER): Payer: Medicare HMO | Admitting: Emergency Medicine

## 2021-12-20 ENCOUNTER — Encounter: Payer: Self-pay | Admitting: Emergency Medicine

## 2021-12-20 VITALS — BP 108/72 | HR 98 | Temp 98.2°F | Ht 62.0 in | Wt 120.2 lb

## 2021-12-20 DIAGNOSIS — Z4802 Encounter for removal of sutures: Secondary | ICD-10-CM

## 2021-12-20 DIAGNOSIS — S0181XD Laceration without foreign body of other part of head, subsequent encounter: Secondary | ICD-10-CM

## 2021-12-20 NOTE — Progress Notes (Signed)
Kenneth Moore 81 y.o.   Chief Complaint  Patient presents with   Follow-up    Suture removal, still some pain     HISTORY OF PRESENT ILLNESS: This is a 81 y.o. male here for suture removal of forehead laceration repair in the emergency department about 10 days ago. Doing well.  Healing well.  HPI   Prior to Admission medications   Medication Sig Start Date End Date Taking? Authorizing Provider  lisinopril (ZESTRIL) 10 MG tablet Take 1 tablet (10 mg total) by mouth daily. 10/27/21  Yes Mayla Biddy, Ines Bloomer, MD  rosuvastatin (CRESTOR) 20 MG tablet Take 1 tablet (20 mg total) by mouth daily. 11/01/21  Yes Cordarryl Monrreal, Ines Bloomer, MD  naproxen (NAPROSYN) 500 MG tablet Take 1 tablet (500 mg total) by mouth 2 (two) times daily. Patient not taking: Reported on 12/20/2021 05/07/20   Volney American, PA-C    No Known Allergies  Patient Active Problem List   Diagnosis Date Noted   Laceration of forehead 12/14/2021   Hypertriglyceridemia 04/28/2021   Dyslipidemia 01/02/2019   Prediabetes 01/02/2019   Essential hypertension 06/24/2018   Arthritis, shoulder region 11/07/2016    Past Medical History:  Diagnosis Date   Hypertension     No past surgical history on file.  Social History   Socioeconomic History   Marital status: Married    Spouse name: Not on file   Number of children: 3   Years of education: Not on file   Highest education level: Not on file  Occupational History   Not on file  Tobacco Use   Smoking status: Never   Smokeless tobacco: Never  Vaping Use   Vaping Use: Never used  Substance and Sexual Activity   Alcohol use: No   Drug use: Not Currently   Sexual activity: Not Currently  Other Topics Concern   Not on file  Social History Narrative   Not on file   Social Determinants of Health   Financial Resource Strain: Not on file  Food Insecurity: Not on file  Transportation Needs: Not on file  Physical Activity: Not on file  Stress: Not on file   Social Connections: Not on file  Intimate Partner Violence: Not on file    No family history on file.   Review of Systems  Constitutional: Negative.  Negative for fever.  HENT: Negative.  Negative for congestion and sore throat.   Respiratory: Negative.  Negative for cough and shortness of breath.   Cardiovascular: Negative.  Negative for chest pain and palpitations.  Gastrointestinal:  Negative for abdominal pain, nausea and vomiting.  Skin: Negative.   Neurological: Negative.  Negative for dizziness and headaches.  All other systems reviewed and are negative.  Today's Vitals   12/20/21 1529  BP: 108/72  Pulse: 98  Temp: 98.2 F (36.8 C)  TempSrc: Oral  SpO2: 98%  Weight: 120 lb 4 oz (54.5 kg)  Height: 5\' 2"  (1.575 m)   Body mass index is 21.99 kg/m.   Physical Exam Vitals reviewed.  Constitutional:      Appearance: Normal appearance.  HENT:     Head: Normocephalic.  Eyes:     Extraocular Movements: Extraocular movements intact.  Cardiovascular:     Rate and Rhythm: Normal rate.  Pulmonary:     Effort: Pulmonary effort is normal.  Skin:    General: Skin is warm and dry.     Comments: Well-healed forehead laceration.  Sutures intact.  Neurological:     Mental Status:  He is alert and oriented to person, place, and time.  Psychiatric:        Mood and Affect: Mood normal.        Behavior: Behavior normal.     Procedure note: After obtaining verbal consent from patient, sutures removed without any problems.  No bleeding. 8 sutures removed.  Tolerated procedure well.  ASSESSMENT & PLAN: Problem List Items Addressed This Visit       Other   Laceration of forehead - Primary    Well healed laceration. No infection.  Sutures intact. Sutures removed without complications.      Other Visit Diagnoses     Visit for suture removal          Patient Instructions  Suture Removal, Care After The following information offers guidance on how to care for  yourself after your procedure. Your health care provider may also give you more specific instructions. If you have problems or questions, contact your health care provider. What can I expect after the procedure? After your stitches (sutures) are removed, it is common to have: Some discomfort and swelling in the area. Slight redness in the area. Follow these instructions at home: If you have a dressing: Wash your hands with soap and water for at least 20 seconds before and after you change your bandage (dressing). If soap and water are not available, use hand sanitizer. Change your dressing as told by your health care provider. If your dressing becomes wet or dirty, or develops a bad smell, change it as soon as possible. If your dressing sticks to your skin, pour warm, clean water over it until it loosens and can be removed without pulling apart the wound edges. Pat the area dry with a soft, clean towel. Do not rub the wound because that may cause bleeding. Wound care  Check your wound every day for signs of infection. Check for: More redness, swelling, or pain. Fluid or blood. New warmth, a rash, or hardness at the wound site. Pus or a bad smell. Wash your hands with soap and water for at least 20 seconds before and after touching your wound. If soap and water are not available, use hand sanitizer. Keep the wound area dry and clean. Clean and pat the wound dry as told by your health care provider. Apply cream or ointment only as told by your health care provider. If skin glue or adhesive strips were applied after sutures were removed, leave these closures in place. They may need to stay in place for 2 weeks or longer. If adhesive strip edges start to loosen and curl up, you may trim the loose edges. Do not remove adhesive strips completely unless your health care provider tells you to do that. Continue to protect the wound from injury. Do not pick at your wound. Picking can cause an  infection. Bathing Do not take baths, swim, or use a hot tub until your health care provider approves. Ask your health care provider if you may take showers. Follow these steps for showering: If you have a dressing, remove it before getting into the shower. In the shower, allow soapy water to get on the wound. Avoid scrubbing the wound. When you get out of the shower, dry the wound by patting it with a clean towel. Reapply a dressing over the wound, if needed. Scar care When your wound has completely healed, help decrease the size of your scar by: Wearing sunscreen over the scar or covering it with clothing when  you are outside. New scars get sunburned easily, which can make scarring worse. Gently massaging the scarred area. This can decrease scar thickness. General instructions Take over-the-counter and prescription medicines only as told by your health care provider. Keep all follow-up visits. This is important. Contact a health care provider if: You have more redness, swelling, or pain around your wound. You have fluid or blood coming from your wound. You have new warmth, a rash, or hardness at the wound site. You have pus or a bad smell coming from your wound. Your wound opens up. Get help right away if: You have a fever or chills. You have red streaks coming from your wound. Summary After your sutures are removed, it is common to have some discomfort and swelling in the area. Wash your hands with soap and water before you change your bandage (dressing). Keep the wound area dry and clean. Do not take baths, swim, or use a hot tub until your health care provider approves. This information is not intended to replace advice given to you by your health care provider. Make sure you discuss any questions you have with your health care provider. Document Revised: 05/25/2020 Document Reviewed: 05/25/2020 Elsevier Patient Education  [place],  MD [facility]

## 2021-12-20 NOTE — Patient Instructions (Signed)
Suture Removal, Care After The following information offers guidance on how to care for yourself after your procedure. Your health care provider may also give you more specific instructions. If you have problems or questions, contact your health care provider. What can I expect after the procedure? After your stitches (sutures) are removed, it is common to have: Some discomfort and swelling in the area. Slight redness in the area. Follow these instructions at home: If you have a dressing: Wash your hands with soap and water for at least 20 seconds before and after you change your bandage (dressing). If soap and water are not available, use hand sanitizer. Change your dressing as told by your health care provider. If your dressing becomes wet or dirty, or develops a bad smell, change it as soon as possible. If your dressing sticks to your skin, pour warm, clean water over it until it loosens and can be removed without pulling apart the wound edges. Pat the area dry with a soft, clean towel. Do not rub the wound because that may cause bleeding. Wound care  Check your wound every day for signs of infection. Check for: More redness, swelling, or pain. Fluid or blood. New warmth, a rash, or hardness at the wound site. Pus or a bad smell. Wash your hands with soap and water for at least 20 seconds before and after touching your wound. If soap and water are not available, use hand sanitizer. Keep the wound area dry and clean. Clean and pat the wound dry as told by your health care provider. Apply cream or ointment only as told by your health care provider. If skin glue or adhesive strips were applied after sutures were removed, leave these closures in place. They may need to stay in place for 2 weeks or longer. If adhesive strip edges start to loosen and curl up, you may trim the loose edges. Do not remove adhesive strips completely unless your health care provider tells you to do that. Continue to  protect the wound from injury. Do not pick at your wound. Picking can cause an infection. Bathing Do not take baths, swim, or use a hot tub until your health care provider approves. Ask your health care provider if you may take showers. Follow these steps for showering: If you have a dressing, remove it before getting into the shower. In the shower, allow soapy water to get on the wound. Avoid scrubbing the wound. When you get out of the shower, dry the wound by patting it with a clean towel. Reapply a dressing over the wound, if needed. Scar care When your wound has completely healed, help decrease the size of your scar by: Wearing sunscreen over the scar or covering it with clothing when you are outside. New scars get sunburned easily, which can make scarring worse. Gently massaging the scarred area. This can decrease scar thickness. General instructions Take over-the-counter and prescription medicines only as told by your health care provider. Keep all follow-up visits. This is important. Contact a health care provider if: You have more redness, swelling, or pain around your wound. You have fluid or blood coming from your wound. You have new warmth, a rash, or hardness at the wound site. You have pus or a bad smell coming from your wound. Your wound opens up. Get help right away if: You have a fever or chills. You have red streaks coming from your wound. Summary After your sutures are removed, it is common to have some discomfort   and swelling in the area. Wash your hands with soap and water before you change your bandage (dressing). Keep the wound area dry and clean. Do not take baths, swim, or use a hot tub until your health care provider approves. This information is not intended to replace advice given to you by your health care provider. Make sure you discuss any questions you have with your health care provider. Document Revised: 05/25/2020 Document Reviewed:  05/25/2020 Elsevier Patient Education  2023 [employer]  

## 2021-12-20 NOTE — Assessment & Plan Note (Signed)
Well healed laceration. No infection.  Sutures intact. Sutures removed without complications.

## 2022-05-08 ENCOUNTER — Other Ambulatory Visit: Payer: Self-pay | Admitting: Emergency Medicine

## 2022-05-08 DIAGNOSIS — E781 Pure hyperglyceridemia: Secondary | ICD-10-CM

## 2022-07-20 ENCOUNTER — Ambulatory Visit (INDEPENDENT_AMBULATORY_CARE_PROVIDER_SITE_OTHER): Payer: Medicare HMO | Admitting: Emergency Medicine

## 2022-07-20 ENCOUNTER — Encounter: Payer: Self-pay | Admitting: Emergency Medicine

## 2022-07-20 VITALS — BP 140/78 | HR 102 | Temp 98.5°F | Ht 62.0 in | Wt 124.0 lb

## 2022-07-20 DIAGNOSIS — R7303 Prediabetes: Secondary | ICD-10-CM | POA: Diagnosis not present

## 2022-07-20 DIAGNOSIS — I10 Essential (primary) hypertension: Secondary | ICD-10-CM

## 2022-07-20 DIAGNOSIS — E785 Hyperlipidemia, unspecified: Secondary | ICD-10-CM

## 2022-07-20 LAB — LIPID PANEL
Cholesterol: 174 mg/dL (ref 0–200)
HDL: 43.3 mg/dL (ref >39.00)
NonHDL: 130.44
Total CHOL/HDL Ratio: 4
Triglycerides: 244 mg/dL — ABNORMAL HIGH (ref 0.0–149.0)
VLDL: 48.8 mg/dL — ABNORMAL HIGH (ref 0.0–40.0)

## 2022-07-20 LAB — COMPREHENSIVE METABOLIC PANEL
ALT: 25 U/L (ref 0–53)
AST: 26 U/L (ref 0–37)
Albumin: 3.9 g/dL (ref 3.5–5.2)
Alkaline Phosphatase: 79 U/L (ref 39–117)
BUN: 20 mg/dL (ref 6–23)
CO2: 27 mEq/L (ref 19–32)
Calcium: 9.2 mg/dL (ref 8.4–10.5)
Chloride: 105 mEq/L (ref 96–112)
Creatinine, Ser: 1.41 mg/dL (ref 0.40–1.50)
GFR: 46.87 mL/min — ABNORMAL LOW (ref >60.00)
Glucose, Bld: 131 mg/dL — ABNORMAL HIGH (ref 70–99)
Potassium: 4.3 mEq/L (ref 3.5–5.1)
Sodium: 139 mEq/L (ref 135–145)
Total Bilirubin: 0.4 mg/dL (ref 0.2–1.2)
Total Protein: 7 g/dL (ref 6.0–8.3)

## 2022-07-20 LAB — HEMOGLOBIN A1C: Hgb A1c MFr Bld: 6 % (ref 4.6–6.5)

## 2022-07-20 LAB — LDL CHOLESTEROL, DIRECT: Direct LDL: 104 mg/dL

## 2022-07-20 MED ORDER — ROSUVASTATIN CALCIUM 20 MG PO TABS: 20.0000 mg | ORAL_TABLET | Freq: Every day | ORAL | 3 refills | Status: DC

## 2022-07-20 MED ORDER — LISINOPRIL 10 MG PO TABS: 10.0000 mg | ORAL_TABLET | Freq: Every day | ORAL | 3 refills | Status: DC

## 2022-07-20 NOTE — Patient Instructions (Signed)
T?ng huy?t p, Ng??i l?n Hypertension, Adult Huy?t p cao (t?ng huy?t p) l khi l?c b?m mu qua ??ng m?ch qu m?nh. ??ng m?ch l cc m?ch mu mang mu t? tim ?i kh?p c? th?. T?ng huy?t p khi?n tim lm vi?c v?t v? h?n ?? b?m mu v c th? khi?n cc ??ng m?ch tr? ln h?p ho?c c?ng. T?ng huy?t p khng ???c ?i?u tr? ho?c ki?m sot c th? d?n t?i nh?i mu c? tim, suy tim, ??t qu?, b?nh th?n v nh?ng v?n ?? khc. Ch? s? ?o huy?t p g?m m?t ch? s? cao trn m?t ch? s? th?p. L t??ng l huy?t a?p cu?a qu v? c?n ph?i d??i 120/80. Ch? s? ??u tin ("??nh") ???c g?i l huy?t p tm thu. ?y l s? ?o p su?t trong ??ng m?ch khi tim qu v? ??p. Ch? s? th? hai ("?y") ???c g?i l huy?t p tm tr??ng. ?y l s? ?o p su?t trong ??ng m?ch khi tim qu v? ngh?[place] nhn g gy ra? Khng r nguyn nhn chnh xc gy ra tnh tr?ng ny. C m?t s? tnh tr?ng d?n ??n huy?t p cao. ?i?u g lm t?ng nguy c?? M?t s? y?u t? nh?t ??nh c th? khi?n cho qu v? d? b? cao huy?t p h?n. M?t s? trong nh?ng y?u t? nguy c? ny n?m trong t?m ki?m sot c?a qu v?, bao g?m: Ht thu?c. Khng t?p th? d?c ho?c cc ho?t ??ng th? ch?t ??y ??Marland Kitchen Th?a cn. ?n qu nhi?u ch?t bo, ???ng, ca-lo, ho?c mu?i (Natri). U?ng qu nhi?u r??u. Cc y?u t? nguy c? khc bao g?m: C ti?n s? c nhn b? b?nh tim, ti?u ???ng, cholesterol cao ho?c b?nh th?n. C?ng th?ng. C ti?n s? gia ?nh b? cao huy?t p v cholesterol cao. Ng?ng th? khi ng? do t?c ngh?n. ?? tu?i. Nguy c? t?ng ln theo ?? tu?i. C cc d?u hi?u ho?c tri?u ch?ng g? Huy?t p cao c th? khng gy ra cc tri?u ch?ng. Huy?t p r?t cao (c?n t?ng huy?t p) c th? gy ra: ?au ??u. Nh?p tim nhanh ho?c khng ??u (?nh tr?ng ng?c). Kh th?. Ch?y mu cam. Bu?n nn v nn. Th? l?c thay ??i. ?au ng?c d? d?i, chng m?t v co gi?t. Ch?n ?on tnh tr?ng ny nh? th? no? Tnh tr?ng ny ???c ch?n ?on b?ng cch ?o huy?t p c?a qu v? lc qu v? ng?i, ?? tay trn m?t b? m?t ph?ng, hai chn khng b?t  cho v hai bn chn b?ng ph?ng trn sn. B?ng qu?n thi?t b? ?o huy?t p s? ???c qu?n tr?c ti?p vo vng da cnh tay pha trn c?a qu v? ngang v?i m?c tim. Huy?t p c?n ???c ?o t nh?t hai l?n trn cng m?t cnh tay. M?t s? tnh tr?ng nh?t ??nh c th? lm cho huy?t p khc nhau gi?a tay ph?i v tay tri c?a qu v?. N?u qu v? c ch? s? huy?t p cao trong m?t l?n khm ho?c qu v? c huy?t p bnh th??ng c km cc y?u t? nguy c? khc, qu v? c th? ???c yu c?u: Tr? l?i vo m?t ngy khc ?? ki?m tra l?i huy?t p. Theo di huy?t p t?i nh trong vng 1 tu?n ho?c lu h?n. N?u qu v? ???c ch?n ?on b? t?ng huy?t p, qu v? c th? c?n th?c hi?n cc xt nghi?m mu ho?c ki?m tra hnh ?nh khc ?? gip chuyn gia ch?m [place] s?c kh?e hi?u nguy c? t?ng th? m?c cc b?nh  tr?ng khc. Tnh tr?ng ny ???c ?i?u tr? nh? th? no? Tnh tr?ng ny ???c ?i?u tr? b?ng cch thay ??i l?i s?ng lnh m?nh, ch?ng h?nh nh? ?n th?c ph?m c l?i cho s?c kh?e, t?p th? d?c nhi?u h?n v gi?m l??ng r??u u?ng vo. Qu v? c th? ???c gi?i thi?u ?? ???c t? v?n v? ch? ?? ?n lnh m?nh v ho?t ??ng th? ch?t. Chuyn gia ch?m [place] s?c kh?e c th? k ??n thu?c n?u thay ??i l?i s?ng khng ?? ?? ??a huy?t p v? m?c c th? ki?m sot ???c v n?u: Huy?t p tm thu c?a qu v? trn 130. Huy?t p tm tr??ng c?a qu v? trn 80. Huy?t p m?c tiu c nhn c?a qu v? c th? khc nhau ty thu?c v tnh tr?ng b?nh l, tu?i v cc y?u t? khc. Tun th? nh?ng h??ng d?n ny ? nh: ?n v u?ng  ?n ch? ?? giu ch?t x? v kali v t natri, ???ng ph? gia v ch?t bo. M?t v d? v? k? ho?ch ?n ki?u ny ???c g?i l ch? ?? ?n DASH. DASH l vi?t t?t c?a Dietary Approaches to Stop Hypertension (Ph??ng php ti?p c?n ch? ?? ?n u?ng ?? lm gi?m huy?t p). ?n theo cch ny: ?n nhi?u tri cy v rau t??i. Vo m?i b?a ?n, c? g?ng dnh m?t n?a ??a cho tri cy v rau c?. ?n ng? c?c nguyn cm, ch?ng h?n nh? m ?ng lm t? b?t m nguyn cm, g?o l?t, ho?c bnh m t? b?t m nguyn cm.  Cho ngu? c?c nguyn ca?m va?o kho?ng m?t ph?n t? ??a. ?n ho?c hu?ng cc s?n ph?m t? s?a t bo, ch?ng h?n nh? s?a ? b? kem ho?c s?a chua t bo. Trnh nh?ng mi?ng th?t nhi?u m?, th?t ? qua ch? bi?n ho?c th?t ??p mu?i v th?t gia c?m c da. Dnh kho?ng m?t ph?n t? ??a c?a qu v? cho cc protein khng m?, ch?ng h?n nh? c, th?t g khng da, ??u, tr?ng, ho?c ??u ph?. Trnh nh?ng th?c ph?m ch? bi?n ho?c lm s?n. Nh?ng th?c ph?m ny th??ng c nhi?u natri, b? sung ???ng v ch?t bo h?n. Gi?m l??ng dng natri hng ngy c?a qu v?. Nhi?u ng??i b? t?ng huy?t p c?n ?n d??i 1.500 mg natri m?i ngy. Khng u?ng r??u n?u: Chuyn gia ch?m [place] s?c kh?e khuyn qu v? khng u?ng r??u. Qu v? c [place], c th? c [place], ho?c ?ang c k? ho?ch c [place]. N?u qu v? u?ng r??u: Gi?i h?n l??ng r??u qu v? u?ng ? m?c: 0-1 ly/ngy ??i v?i n? gi?i. 0-2 ly/ngy ??i v?i nam gi?i. Bi?t m?t ly c bao nhiu r??u. ? M?, m?t ly t??ng ???ng v?i m?t chai bia 12 ao x? (355 mL), m?t ly r??u vang 5 ao x? (148 mL), ho?c m?t ly r??u m?nh 1 ao x? (44 mL). L?i s?ng  H?p tc v?i chuyn gia ch?m [place] s?c kh?e c?a qu v? ?? duy tr tr?ng l??ng c? th? c l?i cho s?c kh?e ho?c ?? gi?m cn. Hy h?i xem tr?ng l??ng no l l t??ng cho qu v?. Dnh t nh?t 30 pht t?p th? d?c c th? khi?n tim qu v? ??p nhanh h?n (t?p th? d?c nh?p ?i?u) h?u h?t cc ngy trong tu?n. Cc ho?t ??ng c th? bao g?m ?i b?, b?i, ho?c ??p xe. Bao g?m bi t?p t?ng c??ng c? (bi t?p khng l?c), ch?ng h?n nh? bi t?p [place] ho?c nng t?, nh? m?t ph?n c?a  thi quen luy?n t?p hng tu?n c?a qu v?. C? g?ng t?p nh?ng lo?i bi t?p ny trong vng 30 pht, t nh?t l 3 ngy m?i tu?n. Khng s? d?ng b?t k? s?n ph?m no c nicotine ho?c thu?c l. Nh?ng s?n ph?m ny bao g?m thu?c l d?ng ht, thu?c l d?ng nhai v d?ng c? ht thu?c, ch?ng h?n nh? thu?c l ?i?n t?. N?u qu v? c?n gip ?? ?? cai thu?c, hy h?i chuyn gia ch?m [place] s?c kh?e. Theo di huy?t p c?a qu v? t?i nh theo h??ng  d?n c?a chuyn gia ch?m [place] s?c kh?e. Tun th? theo t?t c? cc l?n khm l?i. ?i?u ny c vai tr quan tr?ng. Thu?c Ch? s? d?ng thu?c khng k ??n v thu?c k ??n theo ch? d?n c?a chuyn gia ch?m [place] s?c kh?e. Lm theo ch? d?n m?t cch c?n th?n. Thu?c ?i?u tr? huy?t p ph?i ???c dng theo ??n ? k. Khng b? cc li?u thu?c huy?t p. Vi?c b? dng thu?c khi?n qu v? c nguy c? g?p ph?i cc v?n ?? v c th? lm cho thu?c gi?m hi?u qu?Marland Kitchen Hy h?i chuyn gia ch?m [name] s?c kh?e c?a qu v? v? nh?ng tc d?ng ph? ho?c ph?n ?ng v?i thu?c m qu v? ph?i theo di. Hy lin l?c v?i chuyn gia ch?m [place] s?c kh?e n?u qu v?: Ngh? qu v? c ph?n ?ng v?i thu?c ?ang dng. B? ?au ??u ti?p t?c tr? l?i (ti pht). C?m th?y chng m?t. B? s?ng ph ? c? chn. C v?n ?? v? th? l?c. Yu c?u tr? gip ngay l?p t?c n?u qu v?: B? ?au ??u r?t nhi?u ho?c l l?n. B? y?u ho?c t b b?t th??ng. C?m th?y ng?t x?u. B? ?au r?t nhi?u ? ng?c ho?c b?ng. Nn nhi?u l?n. B? kh th?. Nh?ng tri?u ch?ng ny c th? l tr??ng h?p c?p c?u. Yu c?u tr? gip ngay l?p t?c. Hy g?i 911. Khng ch? xem tri?u ch?ng c h?t khng. Khng t? li xe ??n b?nh vi?n. Tm t?t T?ng huy?t p l khi l?c b?m mu qua cc ??ng m?ch c?a qu v? qu m?nh. N?u tnh tr?ng ny khng ???c ki?m sot, n c th? khi?n qu v? c nguy c? b? cc bi?n ch?ng nghim tr?ng. Huy?t p m?c tiu c nhn c?a qu v? c th? khc nhau ty thu?c v tnh tr?ng b?nh l, tu?i v cc y?u t? khc. ??i v?i h?u h?t m?i ng??i, huy?t p bnh th??ng l d??i 120/80. ?i?u tr? t?ng huy?t p b?ng cch thay ??i l?i s?ng, dng thu?c, ho?c k?t h?p c? hai. Thay ??i l?i s?ng bao g?m gi?m cn, ?n ch? ?? ?n c l?i cho s?c kh?e, t natri, t?p th? d?c nhi?u h?n v h?n ch? u?ng r??u. Thng tin ny khng nh?m m?c ?ch thay th? cho l?i khuyn m chuyn gia ch?m [place] s?c kh?e ni v?i qu v?. Hy b?o ??m qu v? ph?i th?o lu?n b?t k? v?n ?? g m qu v? c v?i chuyn gia ch?m [place] s?c kh?e c?a qu v?. Document Revised:  12/30/2020 Document Reviewed: 12/30/2020 Elsevier Patient Education  2024 [employer].

## 2022-07-20 NOTE — Progress Notes (Signed)
Kenneth Moore 82 y.o.   Chief Complaint  Patient presents with   Follow-up    Follow up on BP     HISTORY OF PRESENT ILLNESS: This is a 82 y.o. male here for 46-month follow-up of hypertension and dyslipidemia Accompanied by daughter and wife Overall doing well.  Has no complaints or medical concerns today.  HPI   Prior to Admission medications   Medication Sig Start Date End Date Taking? Authorizing Provider  lisinopril (ZESTRIL) 10 MG tablet Take 1 tablet (10 mg total) by mouth daily. 07/20/22   Georgina Quint, MD  naproxen (NAPROSYN) 500 MG tablet Take 1 tablet (500 mg total) by mouth 2 (two) times daily. Patient not taking: Reported on 12/20/2021 05/07/20   Particia Nearing, PA-C  rosuvastatin (CRESTOR) 20 MG tablet Take 1 tablet (20 mg total) by mouth daily. 07/20/22   Georgina Quint, MD    No Known Allergies  Patient Active Problem List   Diagnosis Date Noted   Hypertriglyceridemia 04/28/2021   Dyslipidemia 01/02/2019   Prediabetes 01/02/2019   Essential hypertension 06/24/2018   Arthritis, shoulder region 11/07/2016    Past Medical History:  Diagnosis Date   Hypertension     No past surgical history on file.  Social History   Socioeconomic History   Marital status: Married    Spouse name: Not on file   Number of children: 3   Years of education: Not on file   Highest education level: Not on file  Occupational History   Not on file  Tobacco Use   Smoking status: Never   Smokeless tobacco: Never  Vaping Use   Vaping Use: Never used  Substance and Sexual Activity   Alcohol use: No   Drug use: Not Currently   Sexual activity: Not Currently  Other Topics Concern   Not on file  Social History Narrative   Not on file   Social Determinants of Health   Financial Resource Strain: Not on file  Food Insecurity: Not on file  Transportation Needs: Not on file  Physical Activity: Not on file  Stress: Not on file  Social Connections: Not on  file  Intimate Partner Violence: Not on file    No family history on file.   Review of Systems  Constitutional: Negative.  Negative for chills and fever.  HENT: Negative.  Negative for congestion and sore throat.   Respiratory: Negative.  Negative for cough and shortness of breath.   Cardiovascular: Negative.  Negative for chest pain and palpitations.  Gastrointestinal:  Negative for abdominal pain, nausea and vomiting.  Genitourinary: Negative.  Negative for dysuria and hematuria.  Skin: Negative.  Negative for rash.  Neurological: Negative.  Negative for dizziness and headaches.  All other systems reviewed and are negative.   Vitals:   07/20/22 1525 07/20/22 1527  BP: (!) 140/78 (!) 140/78  Pulse: (!) 102   Temp: 98.5 F (36.9 C)   SpO2: 95%     Physical Exam Vitals reviewed.  Constitutional:      Appearance: Normal appearance.  HENT:     Head: Normocephalic.     Mouth/Throat:     Mouth: Mucous membranes are moist.     Pharynx: Oropharynx is clear.  Eyes:     Extraocular Movements: Extraocular movements intact.     Conjunctiva/sclera: Conjunctivae normal.     Pupils: Pupils are equal, round, and reactive to light.  Cardiovascular:     Rate and Rhythm: Normal rate and regular rhythm.  Pulses: Normal pulses.     Heart sounds: Normal heart sounds.  Pulmonary:     Effort: Pulmonary effort is normal.     Breath sounds: Normal breath sounds.  Abdominal:     Palpations: Abdomen is soft.     Tenderness: There is no abdominal tenderness.  Musculoskeletal:     Cervical back: No tenderness.  Lymphadenopathy:     Cervical: No cervical adenopathy.  Skin:    General: Skin is warm and dry.     Capillary Refill: Capillary refill takes less than 2 seconds.  Neurological:     Mental Status: He is alert and oriented to person, place, and time.  Psychiatric:        Mood and Affect: Mood normal.        Behavior: Behavior normal.      ASSESSMENT & PLAN: A total of  44 minutes was spent with the patient and counseling/coordination of care regarding preparing for this visit, review of most recent office visit notes, review of multiple chronic medical conditions under management, review of all medications, review of most recent blood work results, cardiovascular risks associated with hypertension and dyslipidemia, education on nutrition, prognosis, documentation and need for follow-up  Problem List Items Addressed This Visit       Cardiovascular and Mediastinum   Essential hypertension - Primary    Well-controlled hypertension with normal blood pressure readings at home Continue lisinopril 10 mg daily Cardiovascular risks associated with hypertension discussed Dietary approaches to stop hypertension discussed Blood work done today Follow-up in 6 months      Relevant Medications   lisinopril (ZESTRIL) 10 MG tablet   rosuvastatin (CRESTOR) 20 MG tablet   Other Relevant Orders   Comprehensive metabolic panel   Lipid panel     Other   Dyslipidemia    Diet and nutrition discussed Cardiovascular risks associated with dyslipidemia discussed Lipid profile done today Continue rosuvastatin 20 mg daily      Relevant Medications   rosuvastatin (CRESTOR) 20 MG tablet   Other Relevant Orders   Comprehensive metabolic panel   Lipid panel   Prediabetes    Diet and nutrition discussed      Relevant Orders   Comprehensive metabolic panel   Lipid panel   Hemoglobin A1c   Patient Instructions  T?ng huy?t p, Ng??i l?n Hypertension, Adult Huy?t p cao (t?ng huy?t p) l khi l?c b?m mu qua ??ng m?ch qu m?nh. ??ng m?ch l cc m?ch mu mang mu t? tim ?i kh?p c? th?. T?ng huy?t p khi?n tim lm vi?c v?t v? h?n ?? b?m mu v c th? khi?n cc ??ng m?ch tr? ln h?p ho?c c?ng. T?ng huy?t p khng ???c ?i?u tr? ho?c ki?m sot c th? d?n t?i nh?i mu c? tim, suy tim, ??t qu?, b?nh th?n v nh?ng v?n ?? khc. Ch? s? ?o huy?t p g?m m?t ch? s? cao trn m?t ch? s?  th?p. L t??ng l huy?t a?p cu?a qu v? c?n ph?i d??i 120/80. Ch? s? ??u tin ("??nh") ???c g?i l huy?t p tm thu. ?y l s? ?o p su?t trong ??ng m?ch khi tim qu v? ??p. Ch? s? th? hai ("?y") ???c g?i l huy?t p tm tr??ng. ?y l s? ?o p su?t trong ??ng m?ch khi tim qu v? ngh?[place] nhn g gy ra? Khng r nguyn nhn chnh xc gy ra tnh tr?ng ny. C m?t s? tnh tr?ng d?n ??n huy?t p cao. ?i?u g lm t?ng nguy c?? M?t s?  y?u t? nh?t ??nh c th? khi?n cho qu v? d? b? cao huy?t p h?n. M?t s? trong nh?ng y?u t? nguy c? ny n?m trong t?m ki?m sot c?a qu v?, bao g?m: Ht thu?c. Khng t?p th? d?c ho?c cc ho?t ??ng th? ch?t ??y ??Marland Kitchen Th?a cn. ?n qu nhi?u ch?t bo, ???ng, ca-lo, ho?c mu?i (N[place]). U?ng qu nhi?u r??u. Cc y?u t? nguy c? khc bao g?m: C ti?n s? c nhn b? b?nh tim, ti?u ???ng, cholesterol cao ho?c b?nh th?n. C?ng th?ng. C ti?n s? gia ?nh b? cao huy?t p v cholesterol cao. Ng?ng th? khi ng? do t?c ngh?n. ?? tu?i. Nguy c? t?ng ln theo ?? tu?i. C cc d?u hi?u ho?c tri?u ch?ng g? Huy?t p cao c th? khng gy ra cc tri?u ch?ng. Huy?t p r?t cao (c?n t?ng huy?t p) c th? gy ra: ?au ??u. Nh?p tim nhanh ho?c khng ??u (?nh tr?ng ng?c). Kh th?. Ch?y mu cam. Bu?n nn v nn. Th? l?c thay ??i. ?au ng?c d? d?i, chng m?t v co gi?t. Ch?n ?on tnh tr?ng ny nh? th? no? Tnh tr?ng ny ???c ch?n ?on b?ng cch ?o huy?t p c?a qu v? lc qu v? ng?i, ?? tay trn m?t b? m?t ph?ng, hai chn khng b?t cho v hai bn chn b?ng ph?ng trn sn. B?ng qu?n thi?t b? ?o huy?t p s? ???c qu?n tr?c ti?p vo vng da cnh tay pha trn c?a qu v? ngang v?i m?c tim. Huy?t p c?n ???c ?o t nh?t hai l?n trn cng m?t cnh tay. M?t s? tnh tr?ng nh?t ??nh c th? lm cho huy?t p khc nhau gi?a tay ph?i v tay tri c?a qu v?. N?u qu v? c ch? s? huy?t p cao trong m?t l?n khm ho?c qu v? c huy?t p bnh th??ng c km cc y?u t? nguy c? khc, qu v? c th? ???c yu  c?u: Tr? l?i vo m?t ngy khc ?? ki?m tra l?i huy?t p. Theo di huy?t p t?i nh trong vng 1 tu?n ho?c lu h?n. N?u qu v? ???c ch?n ?on b? t?ng huy?t p, qu v? c th? c?n th?c hi?n cc xt nghi?m mu ho?c ki?m tra hnh ?nh khc ?? gip chuyn gia ch?m [place] s?c kh?e hi?u nguy c? t?ng th? m?c cc b?nh tr?ng khc. Tnh tr?ng ny ???c ?i?u tr? nh? th? no? Tnh tr?ng ny ???c ?i?u tr? b?ng cch thay ??i l?i s?ng lnh m?nh, ch?ng h?nh nh? ?n th?c ph?m c l?i cho s?c kh?e, t?p th? d?c nhi?u h?n v gi?m l??ng r??u u?ng vo. Qu v? c th? ???c gi?i thi?u ?? ???c t? v?n v? ch? ?? ?n lnh m?nh v ho?t ??ng th? ch?t. Chuyn gia ch?m [place] s?c kh?e c th? k ??n thu?c n?u thay ??i l?i s?ng khng ?? ?? ??a huy?t p v? m?c c th? ki?m sot ???c v n?u: Huy?t p tm thu c?a qu v? trn 130. Huy?t p tm tr??ng c?a qu v? trn 80. Huy?t p m?c tiu c nhn c?a qu v? c th? khc nhau ty thu?c v tnh tr?ng b?nh l, tu?i v cc y?u t? khc. Tun th? nh?ng h??ng d?n ny ? nh: ?n v u?ng  ?n ch? ?? giu ch?t x? v kali v t natri, ???ng ph? gia v ch?t bo. M?t v d? v? k? ho?ch ?n ki?u ny ???c g?i l ch? ?? ?n DASH. DASH l vi?t t?t c?a Dietary Approaches to Stop Hypertension (Ph??ng php ti?p c?n ch? ?? ?  n u?ng ?? lm gi?m huy?t p). ?n theo cch ny: ?n nhi?u tri cy v rau t??i. Vo m?i b?a ?n, c? g?ng dnh m?t n?a ??a cho tri cy v rau c?. ?n ng? c?c nguyn cm, ch?ng h?n nh? m ?ng lm t? b?t m nguyn cm, g?o l?t, ho?c bnh m t? b?t m nguyn cm. Cho ngu? c?c nguyn ca?m va?o kho?ng m?t ph?n t? ??a. ?n ho?c hu?ng cc s?n ph?m t? s?a t bo, ch?ng h?n nh? s?a ? b? kem ho?c s?a chua t bo. Trnh nh?ng mi?ng th?t nhi?u m?, th?t ? qua ch? bi?n ho?c th?t ??p mu?i v th?t gia c?m c da. Dnh kho?ng m?t ph?n t? ??a c?a qu v? cho cc protein khng m?, ch?ng h?n nh? c, th?t g khng da, ??u, tr?ng, ho?c ??u ph?. Trnh nh?ng th?c ph?m ch? bi?n ho?c lm s?n. Nh?ng th?c ph?m ny th??ng c nhi?u natri,  b? sung ???ng v ch?t bo h?n. Gi?m l??ng dng natri hng ngy c?a qu v?. Nhi?u ng??i b? t?ng huy?t p c?n ?n d??i 1.500 mg natri m?i ngy. Khng u?ng r??u n?u: Chuyn gia ch?m [place] s?c kh?e khuyn qu v? khng u?ng r??u. Qu v? c [place], c th? c [place], ho?c ?ang c k? ho?ch c [place]. N?u qu v? u?ng r??u: Gi?i h?n l??ng r??u qu v? u?ng ? m?c: 0-1 ly/ngy ??i v?i n? gi?i. 0-2 ly/ngy ??i v?i nam gi?i. Bi?t m?t ly c bao nhiu r??u. ? M?, m?t ly t??ng ???ng v?i m?t chai bia 12 ao x? (355 mL), m?t ly r??u vang 5 ao x? (148 mL), ho?c m?t ly r??u m?nh 1 ao x? (44 mL). L?i s?ng  H?p tc v?i chuyn gia ch?m [place] s?c kh?e c?a qu v? ?? duy tr tr?ng l??ng c? th? c l?i cho s?c kh?e ho?c ?? gi?m cn. Hy h?i xem tr?ng l??ng no l l t??ng cho qu v?. Dnh t nh?t 30 pht t?p th? d?c c th? khi?n tim qu v? ??p nhanh h?n (t?p th? d?c nh?p ?i?u) h?u h?t cc ngy trong tu?n. Cc ho?t ??ng c th? bao g?m ?i b?, b?i, ho?c ??p xe. Bao g?m bi t?p t?ng c??ng c? (bi t?p khng l?c), ch?ng h?n nh? bi t?p [place] ho?c nng t?, nh? m?t ph?n c?a thi quen luy?n t?p hng tu?n c?a qu v?. C? g?ng t?p nh?ng lo?i bi t?p ny trong vng 30 pht, t nh?t l 3 ngy m?i tu?n. Khng s? d?ng b?t k? s?n ph?m no c nicotine ho?c thu?c l. Nh?ng s?n ph?m ny bao g?m thu?c l d?ng ht, thu?c l d?ng nhai v d?ng c? ht thu?c, ch?ng h?n nh? thu?c l ?i?n t?. N?u qu v? c?n gip ?? ?? cai thu?c, hy h?i chuyn gia ch?m [place] s?c kh?e. Theo di huy?t p c?a qu v? t?i nh theo h??ng d?n c?a chuyn gia ch?m [place] s?c kh?e. Tun th? theo t?t c? cc l?n khm l?i. ?i?u ny c vai tr quan tr?ng. Thu?c Ch? s? d?ng thu?c khng k ??n v thu?c k ??n theo ch? d?n c?a chuyn gia ch?m [place] s?c kh?e. Lm theo ch? d?n m?t cch c?n th?n. Thu?c ?i?u tr? huy?t p ph?i ???c dng theo ??n ? k. Khng b? cc li?u thu?c huy?t p. Vi?c b? dng thu?c khi?n qu v? c nguy c? g?p ph?i cc v?n ?? v c th? lm cho thu?c gi?m hi?u qu?Marland Kitchen Hy h?i chuyn gia  ch?m [place] s?c kh?e c?a qu v? v?  nh?ng tc d?ng ph? ho?c ph?n ?ng v?i thu?c m qu v? ph?i theo di. Hy lin l?c v?i chuyn gia ch?m [place] s?c kh?e n?u qu v?: Ngh? qu v? c ph?n ?ng v?i thu?c ?ang dng. B? ?au ??u ti?p t?c tr? l?i (ti pht). C?m th?y chng m?t. B? s?ng ph ? c? chn. C v?n ?? v? th? l?c. Yu c?u tr? gip ngay l?p t?c n?u qu v?: B? ?au ??u r?t nhi?u ho?c l l?n. B? y?u ho?c t b b?t th??ng. C?m th?y ng?t x?u. B? ?au r?t nhi?u ? ng?c ho?c b?ng. Nn nhi?u l?n. B? kh th?. Nh?ng tri?u ch?ng ny c th? l tr??ng h?p c?p c?u. Yu c?u tr? gip ngay l?p t?c. Hy g?i 911. Khng ch? xem tri?u ch?ng c h?t khng. Khng t? li xe ??n b?nh vi?n. Tm t?t T?ng huy?t p l khi l?c b?m mu qua cc ??ng m?ch c?a qu v? qu m?nh. N?u tnh tr?ng ny khng ???c ki?m sot, n c th? khi?n qu v? c nguy c? b? cc bi?n ch?ng nghim tr?ng. Huy?t p m?c tiu c nhn c?a qu v? c th? khc nhau ty thu?c v tnh tr?ng b?nh l, tu?i v cc y?u t? khc. ??i v?i h?u h?t m?i ng??i, huy?t p bnh th??ng l d??i 120/80. ?i?u tr? t?ng huy?t p b?ng cch thay ??i l?i s?ng, dng thu?c, ho?c k?t h?p c? hai. Thay ??i l?i s?ng bao g?m gi?m cn, ?n ch? ?? ?n c l?i cho s?c kh?e, t natri, t?p th? d?c nhi?u h?n v h?n ch? u?ng r??u. Thng tin ny khng nh?m m?c ?ch thay th? cho l?i khuyn m chuyn gia ch?m [place] s?c kh?e ni v?i qu v?. Hy b?o ??m qu v? ph?i th?o lu?n b?t k? v?n ?? g m qu v? c v?i chuyn gia ch?m [place] s?c kh?e c?a qu v?. Document Revised: 12/30/2020 Document Reviewed: 12/30/2020 Elsevier Patient Education  2024 Elsevier Inc.     Edwina Barth, MD [facility]

## 2022-07-20 NOTE — Assessment & Plan Note (Signed)
Diet and nutrition discussed. 

## 2022-07-20 NOTE — Assessment & Plan Note (Signed)
Diet and nutrition discussed Cardiovascular risks associated with dyslipidemia discussed Lipid profile done today Continue rosuvastatin 20 mg daily

## 2022-07-20 NOTE — Assessment & Plan Note (Signed)
Well-controlled hypertension with normal blood pressure readings at home Continue lisinopril 10 mg daily Cardiovascular risks associated with hypertension discussed Dietary approaches to stop hypertension discussed Blood work done today Follow-up in 6 months

## 2022-12-13 ENCOUNTER — Ambulatory Visit: Payer: Medicare HMO

## 2023-01-18 ENCOUNTER — Encounter: Payer: Self-pay | Admitting: Emergency Medicine

## 2023-01-18 ENCOUNTER — Ambulatory Visit: Payer: Medicare HMO | Admitting: Emergency Medicine

## 2023-01-18 VITALS — BP 132/74 | HR 97 | Temp 97.7°F | Ht 62.0 in | Wt 127.0 lb

## 2023-01-18 DIAGNOSIS — N1831 Chronic kidney disease, stage 3a: Secondary | ICD-10-CM

## 2023-01-18 DIAGNOSIS — R7303 Prediabetes: Secondary | ICD-10-CM | POA: Diagnosis not present

## 2023-01-18 DIAGNOSIS — I10 Essential (primary) hypertension: Secondary | ICD-10-CM

## 2023-01-18 DIAGNOSIS — E785 Hyperlipidemia, unspecified: Secondary | ICD-10-CM | POA: Diagnosis not present

## 2023-01-18 LAB — CBC WITH DIFFERENTIAL/PLATELET
Basophils Absolute: 0 10*3/uL (ref 0.0–0.1)
Basophils Relative: 0.5 % (ref 0.0–3.0)
Eosinophils Absolute: 0.1 10*3/uL (ref 0.0–0.7)
Eosinophils Relative: 1.1 % (ref 0.0–5.0)
HCT: 44.3 % (ref 39.0–52.0)
Hemoglobin: 15.7 g/dL (ref 13.0–17.0)
Lymphocytes Relative: 26.8 % (ref 12.0–46.0)
Lymphs Abs: 2.1 10*3/uL (ref 0.7–4.0)
MCHC: 35.5 g/dL (ref 30.0–36.0)
MCV: 98.1 fL (ref 78.0–100.0)
Monocytes Absolute: 0.8 10*3/uL (ref 0.1–1.0)
Monocytes Relative: 10.4 % (ref 3.0–12.0)
Neutro Abs: 4.8 10*3/uL (ref 1.4–7.7)
Neutrophils Relative %: 61.2 % (ref 43.0–77.0)
Platelets: 181 10*3/uL (ref 150.0–400.0)
RBC: 4.52 Mil/uL (ref 4.22–5.81)
RDW: 13 % (ref 11.5–15.5)
WBC: 7.8 10*3/uL (ref 4.0–10.5)

## 2023-01-18 LAB — COMPREHENSIVE METABOLIC PANEL
ALT: 23 U/L (ref 0–53)
AST: 24 U/L (ref 0–37)
Albumin: 4.2 g/dL (ref 3.5–5.2)
Alkaline Phosphatase: 80 U/L (ref 39–117)
BUN: 23 mg/dL (ref 6–23)
CO2: 28 meq/L (ref 19–32)
Calcium: 9.4 mg/dL (ref 8.4–10.5)
Chloride: 105 meq/L (ref 96–112)
Creatinine, Ser: 1.32 mg/dL (ref 0.40–1.50)
GFR: 50.55 mL/min — ABNORMAL LOW (ref >60.00)
Glucose, Bld: 150 mg/dL — ABNORMAL HIGH (ref 70–99)
Potassium: 4 meq/L (ref 3.5–5.1)
Sodium: 139 meq/L (ref 135–145)
Total Bilirubin: 0.5 mg/dL (ref 0.2–1.2)
Total Protein: 7.2 g/dL (ref 6.0–8.3)

## 2023-01-18 LAB — LIPID PANEL
Cholesterol: 225 mg/dL — ABNORMAL HIGH (ref 0–200)
HDL: 40.7 mg/dL (ref >39.00)
LDL Cholesterol: 122 mg/dL — ABNORMAL HIGH (ref 0–99)
NonHDL: 184.13
Total CHOL/HDL Ratio: 6
Triglycerides: 313 mg/dL — ABNORMAL HIGH (ref 0.0–149.0)
VLDL: 62.6 mg/dL — ABNORMAL HIGH (ref 0.0–40.0)

## 2023-01-18 LAB — HEMOGLOBIN A1C: Hgb A1c MFr Bld: 6.4 % (ref 4.6–6.5)

## 2023-01-18 MED ORDER — ROSUVASTATIN CALCIUM 20 MG PO TABS: 20.0000 mg | ORAL_TABLET | Freq: Every day | ORAL | 3 refills | Status: DC

## 2023-01-18 MED ORDER — LISINOPRIL 10 MG PO TABS: 10.0000 mg | ORAL_TABLET | Freq: Every day | ORAL | 3 refills | Status: DC

## 2023-01-18 NOTE — Patient Instructions (Signed)
Health Maintenance After Age 81 After age 81, you are at a higher risk for certain long-term diseases and infections as well as injuries from falls. Falls are a major cause of broken bones and head injuries in people who are older than age 81. Getting regular preventive care can help to keep you healthy and well. Preventive care includes getting regular testing and making lifestyle changes as recommended by your health care provider. Talk with your health care provider about: Which screenings and tests you should have. A screening is a test that checks for a disease when you have no symptoms. A diet and exercise plan that is right for you. What should I know about screenings and tests to prevent falls? Screening and testing are the best ways to find a health problem early. Early diagnosis and treatment give you the best chance of managing medical conditions that are common after age 81. Certain conditions and lifestyle choices may make you more likely to have a fall. Your health care provider may recommend: Regular vision checks. Poor vision and conditions such as cataracts can make you more likely to have a fall. If you wear glasses, make sure to get your prescription updated if your vision changes. Medicine review. Work with your health care provider to regularly review all of the medicines you are taking, including over-the-counter medicines. Ask your health care provider about any side effects that may make you more likely to have a fall. Tell your health care provider if any medicines that you take make you feel dizzy or sleepy. Strength and balance checks. Your health care provider may recommend certain tests to check your strength and balance while standing, walking, or changing positions. Foot health exam. Foot pain and numbness, as well as not wearing proper footwear, can make you more likely to have a fall. Screenings, including: Osteoporosis screening. Osteoporosis is a condition that causes  the bones to get weaker and break more easily. Blood pressure screening. Blood pressure changes and medicines to control blood pressure can make you feel dizzy. Depression screening. You may be more likely to have a fall if you have a fear of falling, feel depressed, or feel unable to do activities that you used to do. Alcohol use screening. Using too much alcohol can affect your balance and may make you more likely to have a fall. Follow these instructions at home: Lifestyle Do not drink alcohol if: Your health care provider tells you not to drink. If you drink alcohol: Limit how much you have to: 0-1 drink a day for women. 0-2 drinks a day for men. Know how much alcohol is in your drink. In the [place] one drink equals one 12 oz bottle of beer (355 mL), one 5 oz glass of wine (148 mL), or one 1 oz glass of hard liquor (44 mL). Do not use any products that contain nicotine or tobacco. These products include cigarettes, chewing tobacco, and vaping devices, such as e-cigarettes. If you need help quitting, ask your health care provider. Activity  Follow a regular exercise program to stay fit. This will help you maintain your balance. Ask your health care provider what types of exercise are appropriate for you. If you need a cane or walker, use it as recommended by your health care provider. Wear supportive shoes that have nonskid soles. Safety  Remove any tripping hazards, such as rugs, cords, and clutter. Install safety equipment such as grab bars in bathrooms and safety rails on stairs. Keep rooms and walkways   well-lit. General instructions Talk with your health care provider about your risks for falling. Tell your health care provider if: You fall. Be sure to tell your health care provider about all falls, even ones that seem minor. You feel dizzy, tiredness (fatigue), or off-balance. Take over-the-counter and prescription medicines only as told by your health care provider. These include  supplements. Eat a healthy diet and maintain a healthy weight. A healthy diet includes low-fat dairy products, low-fat (lean) meats, and fiber from whole grains, beans, and lots of fruits and vegetables. Stay current with your vaccines. Schedule regular health, dental, and eye exams. Summary Having a healthy lifestyle and getting preventive care can help to protect your health and wellness after age 81. Screening and testing are the best way to find a health problem early and help you avoid having a fall. Early diagnosis and treatment give you the best chance for managing medical conditions that are more common for people who are older than age 81. Falls are a major cause of broken bones and head injuries in people who are older than age 81. Take precautions to prevent a fall at home. Work with your health care provider to learn what changes you can make to improve your health and wellness and to prevent falls. This information is not intended to replace advice given to you by your health care provider. Make sure you discuss any questions you have with your health care provider. Document Revised: 06/21/2020 Document Reviewed: 06/21/2020 Elsevier Patient Education  2024 Elsevier Inc.  

## 2023-01-18 NOTE — Assessment & Plan Note (Signed)
Well-controlled hypertension with normal blood pressure readings at home Continue lisinopril 10 mg daily Cardiovascular risks associated with hypertension discussed Dietary approaches to stop hypertension discussed Blood work done today Follow-up in 6 months

## 2023-01-18 NOTE — Assessment & Plan Note (Signed)
Chronic stable condition Advised to stay well-hydrated and avoid frequent use of NSAIDs

## 2023-01-18 NOTE — Progress Notes (Signed)
Kenneth Moore 82 y.o.   Chief Complaint  Patient presents with   Follow-up    Patient here for 6 month f/u for HTN. Med refill     HISTORY OF PRESENT ILLNESS: This is a 82 y.o. male here for 58-month follow-up of chronic medical conditions including hypertension Accompanied by wife and daughter Overall doing well.  Has no complaints or medical concerns today.  HPI   Prior to Admission medications   Medication Sig Start Date End Date Taking? Authorizing Provider  lisinopril (ZESTRIL) 10 MG tablet Take 1 tablet (10 mg total) by mouth daily. 01/18/23   Georgina Quint, MD  naproxen (NAPROSYN) 500 MG tablet Take 1 tablet (500 mg total) by mouth 2 (two) times daily. Patient not taking: Reported on 12/20/2021 05/07/20   Particia Nearing, PA-C  rosuvastatin (CRESTOR) 20 MG tablet Take 1 tablet (20 mg total) by mouth daily. 01/18/23   Georgina Quint, MD    No Known Allergies  Patient Active Problem List   Diagnosis Date Noted   Stage 3a chronic kidney disease (HCC) 01/18/2023   Hypertriglyceridemia 04/28/2021   Dyslipidemia 01/02/2019   Prediabetes 01/02/2019   Essential hypertension 06/24/2018   Arthritis, shoulder region 11/07/2016    Past Medical History:  Diagnosis Date   Hypertension     No past surgical history on file.  Social History   Socioeconomic History   Marital status: Married    Spouse name: Not on file   Number of children: 3   Years of education: Not on file   Highest education level: Not on file  Occupational History   Not on file  Tobacco Use   Smoking status: Never   Smokeless tobacco: Never  Vaping Use   Vaping status: Never Used  Substance and Sexual Activity   Alcohol use: No   Drug use: Not Currently   Sexual activity: Not Currently  Other Topics Concern   Not on file  Social History Narrative   Not on file   Social Determinants of Health   Financial Resource Strain: Not on file  Food Insecurity: Not on file   Transportation Needs: Not on file  Physical Activity: Not on file  Stress: Not on file  Social Connections: Not on file  Intimate Partner Violence: Not on file    No family history on file.   Review of Systems  Constitutional: Negative.  Negative for chills and fever.  HENT: Negative.  Negative for congestion and sore throat.   Respiratory: Negative.  Negative for cough and shortness of breath.   Cardiovascular: Negative.  Negative for chest pain and palpitations.  Gastrointestinal: Negative.  Negative for abdominal pain, diarrhea, nausea and vomiting.  Genitourinary: Negative.  Negative for dysuria and hematuria.  Skin: Negative.  Negative for rash.  Neurological: Negative.  Negative for dizziness and headaches.    Vitals:   01/18/23 1530  BP: 132/74  Pulse: 97  Temp: 97.7 F (36.5 C)  SpO2: 98%    Physical Exam Vitals reviewed.  Constitutional:      Appearance: Normal appearance.  HENT:     Head: Normocephalic.     Mouth/Throat:     Mouth: Mucous membranes are moist.     Pharynx: Oropharynx is clear.  Eyes:     Extraocular Movements: Extraocular movements intact.     Conjunctiva/sclera: Conjunctivae normal.     Pupils: Pupils are equal, round, and reactive to light.  Cardiovascular:     Rate and Rhythm: Normal rate and  regular rhythm.     Pulses: Normal pulses.     Heart sounds: Normal heart sounds.  Pulmonary:     Effort: Pulmonary effort is normal.     Breath sounds: Normal breath sounds.  Abdominal:     Palpations: Abdomen is soft.     Tenderness: There is no abdominal tenderness.  Musculoskeletal:     Cervical back: No tenderness.  Lymphadenopathy:     Cervical: No cervical adenopathy.  Skin:    General: Skin is warm and dry.     Capillary Refill: Capillary refill takes less than 2 seconds.  Neurological:     General: No focal deficit present.     Mental Status: He is alert and oriented to person, place, and time.  Psychiatric:        Mood and  Affect: Mood normal.        Behavior: Behavior normal.      ASSESSMENT & PLAN: A total of 42 minutes was spent with the patient and counseling/coordination of care regarding preparing for this visit, review of most recent office visit notes, review of multiple chronic medical conditions and their management, review of all medications, review of most recent bloodwork results, review of health maintenance items, education on nutrition, prognosis, documentation, and need for follow up. .  Problem List Items Addressed This Visit       Cardiovascular and Mediastinum   Essential hypertension - Primary    Well-controlled hypertension with normal blood pressure readings at home Continue lisinopril 10 mg daily Cardiovascular risks associated with hypertension discussed Dietary approaches to stop hypertension discussed Blood work done today Follow-up in 6 months      Relevant Medications   rosuvastatin (CRESTOR) 20 MG tablet   lisinopril (ZESTRIL) 10 MG tablet   Other Relevant Orders   CBC with Differential/Platelet   Hemoglobin A1c   Comprehensive metabolic panel   Lipid panel     Genitourinary   Stage 3a chronic kidney disease (HCC)    Chronic stable condition Advised to stay well-hydrated and avoid frequent use of NSAIDs      Relevant Orders   CBC with Differential/Platelet   Hemoglobin A1c   Comprehensive metabolic panel   Lipid panel     Other   Dyslipidemia    Diet and nutrition discussed Cardiovascular risks associated with dyslipidemia discussed Lipid profile done today Continue rosuvastatin 20 mg daily      Relevant Medications   rosuvastatin (CRESTOR) 20 MG tablet   Other Relevant Orders   CBC with Differential/Platelet   Hemoglobin A1c   Comprehensive metabolic panel   Lipid panel   Prediabetes    Chronic stable condition Hemoglobin A1c done today Diet and nutrition discussed      Relevant Orders   CBC with Differential/Platelet   Hemoglobin A1c    Comprehensive metabolic panel   Lipid panel   Patient Instructions  Health Maintenance After Age 63 After age 4, you are at a higher risk for certain long-term diseases and infections as well as injuries from falls. Falls are a major cause of broken bones and head injuries in people who are older than age 43. Getting regular preventive care can help to keep you healthy and well. Preventive care includes getting regular testing and making lifestyle changes as recommended by your health care provider. Talk with your health care provider about: Which screenings and tests you should have. A screening is a test that checks for a disease when you have no symptoms. A diet  and exercise plan that is right for you. What should I know about screenings and tests to prevent falls? Screening and testing are the best ways to find a health problem early. Early diagnosis and treatment give you the best chance of managing medical conditions that are common after age 46. Certain conditions and lifestyle choices may make you more likely to have a fall. Your health care provider may recommend: Regular vision checks. Poor vision and conditions such as cataracts can make you more likely to have a fall. If you wear glasses, make sure to get your prescription updated if your vision changes. Medicine review. Work with your health care provider to regularly review all of the medicines you are taking, including over-the-counter medicines. Ask your health care provider about any side effects that may make you more likely to have a fall. Tell your health care provider if any medicines that you take make you feel dizzy or sleepy. Strength and balance checks. Your health care provider may recommend certain tests to check your strength and balance while standing, walking, or changing positions. Foot health exam. Foot pain and numbness, as well as not wearing proper footwear, can make you more likely to have a fall. Screenings,  including: Osteoporosis screening. Osteoporosis is a condition that causes the bones to get weaker and break more easily. Blood pressure screening. Blood pressure changes and medicines to control blood pressure can make you feel dizzy. Depression screening. You may be more likely to have a fall if you have a fear of falling, feel depressed, or feel unable to do activities that you used to do. Alcohol use screening. Using too much alcohol can affect your balance and may make you more likely to have a fall. Follow these instructions at home: Lifestyle Do not drink alcohol if: Your health care provider tells you not to drink. If you drink alcohol: Limit how much you have to: 0-1 drink a day for women. 0-2 drinks a day for men. Know how much alcohol is in your drink. In the [place] one drink equals one 12 oz bottle of beer (355 mL), one 5 oz glass of wine (148 mL), or one 1 oz glass of hard liquor (44 mL). Do not use any products that contain nicotine or tobacco. These products include cigarettes, chewing tobacco, and vaping devices, such as e-cigarettes. If you need help quitting, ask your health care provider. Activity  Follow a regular exercise program to stay fit. This will help you maintain your balance. Ask your health care provider what types of exercise are appropriate for you. If you need a cane or walker, use it as recommended by your health care provider. Wear supportive shoes that have nonskid soles. Safety  Remove any tripping hazards, such as rugs, cords, and clutter. Install safety equipment such as grab bars in bathrooms and safety rails on stairs. Keep rooms and walkways well-lit. General instructions Talk with your health care provider about your risks for falling. Tell your health care provider if: You fall. Be sure to tell your health care provider about all falls, even ones that seem minor. You feel dizzy, tiredness (fatigue), or off-balance. Take over-the-counter and  prescription medicines only as told by your health care provider. These include supplements. Eat a healthy diet and maintain a healthy weight. A healthy diet includes low-fat dairy products, low-fat (lean) meats, and fiber from whole grains, beans, and lots of fruits and vegetables. Stay current with your vaccines. Schedule regular health, dental, and eye exams.  Summary Having a healthy lifestyle and getting preventive care can help to protect your health and wellness after age 3. Screening and testing are the best way to find a health problem early and help you avoid having a fall. Early diagnosis and treatment give you the best chance for managing medical conditions that are more common for people who are older than age 54. Falls are a major cause of broken bones and head injuries in people who are older than age 18. Take precautions to prevent a fall at home. Work with your health care provider to learn what changes you can make to improve your health and wellness and to prevent falls. This information is not intended to replace advice given to you by your health care provider. Make sure you discuss any questions you have with your health care provider. Document Revised: 06/21/2020 Document Reviewed: 06/21/2020 Elsevier Patient Education  2024 [employer]      Edwina Barth, MD [facility]

## 2023-01-18 NOTE — Assessment & Plan Note (Signed)
Chronic stable condition Hemoglobin A1c done today Diet and nutrition discussed

## 2023-01-18 NOTE — Assessment & Plan Note (Signed)
Diet and nutrition discussed Cardiovascular risks associated with dyslipidemia discussed Lipid profile done today Continue rosuvastatin 20 mg daily

## 2023-01-30 ENCOUNTER — Encounter: Payer: Self-pay | Admitting: Radiology

## 2023-01-30 NOTE — Progress Notes (Signed)
Letter with labs sent to pt

## 2023-07-19 ENCOUNTER — Ambulatory Visit

## 2023-07-19 ENCOUNTER — Ambulatory Visit (INDEPENDENT_AMBULATORY_CARE_PROVIDER_SITE_OTHER): Payer: Medicare HMO | Admitting: Emergency Medicine

## 2023-07-19 ENCOUNTER — Encounter: Payer: Self-pay | Admitting: Emergency Medicine

## 2023-07-19 VITALS — BP 130/70 | HR 94 | Ht 62.75 in | Wt 127.8 lb

## 2023-07-19 VITALS — BP 130/70 | HR 94 | Temp 98.1°F | Ht 62.75 in | Wt 127.0 lb

## 2023-07-19 DIAGNOSIS — R7303 Prediabetes: Secondary | ICD-10-CM

## 2023-07-19 DIAGNOSIS — E785 Hyperlipidemia, unspecified: Secondary | ICD-10-CM

## 2023-07-19 DIAGNOSIS — Z0000 Encounter for general adult medical examination without abnormal findings: Secondary | ICD-10-CM | POA: Diagnosis not present

## 2023-07-19 DIAGNOSIS — I10 Essential (primary) hypertension: Secondary | ICD-10-CM

## 2023-07-19 DIAGNOSIS — N1831 Chronic kidney disease, stage 3a: Secondary | ICD-10-CM

## 2023-07-19 LAB — CBC WITH DIFFERENTIAL/PLATELET
Basophils Absolute: 0 10*3/uL (ref 0.0–0.1)
Basophils Relative: 0.5 % (ref 0.0–3.0)
Eosinophils Absolute: 0.1 10*3/uL (ref 0.0–0.7)
Eosinophils Relative: 1.3 % (ref 0.0–5.0)
HCT: 42.5 % (ref 39.0–52.0)
Hemoglobin: 14.5 g/dL (ref 13.0–17.0)
Lymphocytes Relative: 24.6 % (ref 12.0–46.0)
Lymphs Abs: 1.9 10*3/uL (ref 0.7–4.0)
MCHC: 34.1 g/dL (ref 30.0–36.0)
MCV: 96.9 fl (ref 78.0–100.0)
Monocytes Absolute: 0.7 10*3/uL (ref 0.1–1.0)
Monocytes Relative: 8.8 % (ref 3.0–12.0)
Neutro Abs: 5.1 10*3/uL (ref 1.4–7.7)
Neutrophils Relative %: 64.8 % (ref 43.0–77.0)
Platelets: 193 10*3/uL (ref 150.0–400.0)
RBC: 4.38 Mil/uL (ref 4.22–5.81)
RDW: 13.4 % (ref 11.5–15.5)
WBC: 7.8 10*3/uL (ref 4.0–10.5)

## 2023-07-19 LAB — HEMOGLOBIN A1C: Hgb A1c MFr Bld: 6.4 % (ref 4.6–6.5)

## 2023-07-19 NOTE — Patient Instructions (Signed)
 Health Maintenance After Age 82 After age 4, you are at a higher risk for certain long-term diseases and infections as well as injuries from falls. Falls are a major cause of broken bones and head injuries in people who are older than age 47. Getting regular preventive care can help to keep you healthy and well. Preventive care includes getting regular testing and making lifestyle changes as recommended by your health care provider. Talk with your health care provider about: Which screenings and tests you should have. A screening is a test that checks for a disease when you have no symptoms. A diet and exercise plan that is right for you. What should I know about screenings and tests to prevent falls? Screening and testing are the best ways to find a health problem early. Early diagnosis and treatment give you the best chance of managing medical conditions that are common after age 37. Certain conditions and lifestyle choices may make you more likely to have a fall. Your health care provider may recommend: Regular vision checks. Poor vision and conditions such as cataracts can make you more likely to have a fall. If you wear glasses, make sure to get your prescription updated if your vision changes. Medicine review. Work with your health care provider to regularly review all of the medicines you are taking, including over-the-counter medicines. Ask your health care provider about any side effects that may make you more likely to have a fall. Tell your health care provider if any medicines that you take make you feel dizzy or sleepy. Strength and balance checks. Your health care provider may recommend certain tests to check your strength and balance while standing, walking, or changing positions. Foot health exam. Foot pain and numbness, as well as not wearing proper footwear, can make you more likely to have a fall. Screenings, including: Osteoporosis screening. Osteoporosis is a condition that causes  the bones to get weaker and break more easily. Blood pressure screening. Blood pressure changes and medicines to control blood pressure can make you feel dizzy. Depression screening. You may be more likely to have a fall if you have a fear of falling, feel depressed, or feel unable to do activities that you used to do. Alcohol use screening. Using too much alcohol can affect your balance and may make you more likely to have a fall. Follow these instructions at home: Lifestyle Do not drink alcohol if: Your health care provider tells you not to drink. If you drink alcohol: Limit how much you have to: 0-1 drink a day for women. 0-2 drinks a day for men. Know how much alcohol is in your drink. In the [place] one drink equals one 12 oz bottle of beer (355 mL), one 5 oz glass of wine (148 mL), or one 1 oz glass of hard liquor (44 mL). Do not use any products that contain nicotine or tobacco. These products include cigarettes, chewing tobacco, and vaping devices, such as e-cigarettes. If you need help quitting, ask your health care provider. Activity  Follow a regular exercise program to stay fit. This will help you maintain your balance. Ask your health care provider what types of exercise are appropriate for you. If you need a cane or walker, use it as recommended by your health care provider. Wear supportive shoes that have nonskid soles. Safety  Remove any tripping hazards, such as rugs, cords, and clutter. Install safety equipment such as grab bars in bathrooms and safety rails on stairs. Keep rooms and walkways  well-lit. General instructions Talk with your health care provider about your risks for falling. Tell your health care provider if: You fall. Be sure to tell your health care provider about all falls, even ones that seem minor. You feel dizzy, tiredness (fatigue), or off-balance. Take over-the-counter and prescription medicines only as told by your health care provider. These include  supplements. Eat a healthy diet and maintain a healthy weight. A healthy diet includes low-fat dairy products, low-fat (lean) meats, and fiber from whole grains, beans, and lots of fruits and vegetables. Stay current with your vaccines. Schedule regular health, dental, and eye exams. Summary Having a healthy lifestyle and getting preventive care can help to protect your health and wellness after age 11. Screening and testing are the best way to find a health problem early and help you avoid having a fall. Early diagnosis and treatment give you the best chance for managing medical conditions that are more common for people who are older than age 28. Falls are a major cause of broken bones and head injuries in people who are older than age 48. Take precautions to prevent a fall at home. Work with your health care provider to learn what changes you can make to improve your health and wellness and to prevent falls. This information is not intended to replace advice given to you by your health care provider. Make sure you discuss any questions you have with your health care provider. Document Revised: 06/21/2020 Document Reviewed: 06/21/2020 Elsevier Patient Education  2024 [employer].

## 2023-07-19 NOTE — Assessment & Plan Note (Signed)
Diet and nutrition discussed Cardiovascular risks associated with dyslipidemia discussed Lipid profile done today Continue rosuvastatin 20 mg daily

## 2023-07-19 NOTE — Progress Notes (Addendum)
 Subjective:   Kenneth Moore is a 83 y.o. who presents for a Medicare Wellness preventive visit.  As a reminder, Annual Wellness Visits don't include a physical exam, and some assessments may be limited, especially if this visit is performed virtually. We may recommend an in-person follow-up visit with your provider if needed.  Visit Complete: In person  Persons Participating in Visit: Patient assisted by interpreter, wife and daughter.  AWV Questionnaire: No: Patient Medicare AWV questionnaire was not completed prior to this visit.  Cardiac Risk Factors include: advanced age (>12men, >37 women);dyslipidemia;male gender;hypertension;Other (see comment), Risk factor comments: CKD stage 3     Objective:     Today's Vitals   07/19/23 1516  BP: 130/70  Pulse: 94  SpO2: 99%  Weight: 127 lb 12.8 oz (58 kg)  Height: 5' 2.75" (1.594 m)   Body mass index is 22.82 kg/m.     07/19/2023    3:07 PM 12/09/2021    4:21 PM  Advanced Directives  Does Patient Have a Medical Advance Directive? No No  Would patient like information on creating a medical advance directive? No - Patient declined     Current Medications (verified) Outpatient Encounter Medications as of 07/19/2023  Medication Sig   lisinopril  (ZESTRIL ) 10 MG tablet Take 1 tablet (10 mg total) by mouth daily.   rosuvastatin  (CRESTOR ) 20 MG tablet Take 1 tablet (20 mg total) by mouth daily.   naproxen  (NAPROSYN ) 500 MG tablet Take 1 tablet (500 mg total) by mouth 2 (two) times daily. (Patient not taking: Reported on 12/20/2021)   No facility-administered encounter medications on file as of 07/19/2023.    Allergies (verified) Patient has no known allergies.   History: Past Medical History:  Diagnosis Date   Hypertension    History reviewed. No pertinent surgical history. History reviewed. No pertinent family history. Social History   Socioeconomic History   Marital status: Married    Spouse name: Not on file   Number of  children: 3   Years of education: Not on file   Highest education level: Not on file  Occupational History   Occupation: RETIRED  Tobacco Use   Smoking status: Never   Smokeless tobacco: Never  Vaping Use   Vaping status: Never Used  Substance and Sexual Activity   Alcohol use: No   Drug use: Not Currently   Sexual activity: Not Currently  Other Topics Concern   Not on file  Social History Narrative   Lives with wife and daughter/2025   Social Drivers of Health   Financial Resource Strain: Low Risk  (07/19/2023)   Overall Financial Resource Strain (CARDIA)    Difficulty of Paying Living Expenses: Not hard at all  Food Insecurity: No Food Insecurity (07/19/2023)   Hunger Vital Sign    Worried About Running Out of Food in the Last Year: Never true    Ran Out of Food in the Last Year: Never true  Transportation Needs: No Transportation Needs (07/19/2023)   PRAPARE - Administrator, Civil Service (Medical): No    Lack of Transportation (Non-Medical): No  Physical Activity: Insufficiently Active (07/19/2023)   Exercise Vital Sign    Days of Exercise per Week: 4 days    Minutes of Exercise per Session: 20 min  Stress: No Stress Concern Present (07/19/2023)   [employer] of Occupational Health - Occupational Stress Questionnaire    Feeling of Stress : Not at all  Social Connections: Moderately Integrated (07/19/2023)   Social  Connection and Isolation Panel [NHANES]    Frequency of Communication with Friends and Family: Never    Frequency of Social Gatherings with Friends and Family: Never    Attends Religious Services: More than 4 times per year    Active Member of [employer] or Organizations: Yes    Attends Banker Meetings: Never    Marital Status: Married    Tobacco Counseling Counseling given: Not Answered    Clinical Intake:  Pre-visit preparation completed: Yes  Pain : No/denies pain     BMI - recorded: 22.68 Nutritional Status: BMI of 19-24   Normal Nutritional Risks: None Diabetes: No  Lab Results  Component Value Date   HGBA1C 6.4 07/19/2023   HGBA1C 6.4 01/18/2023   HGBA1C 6.0 07/20/2022     How often do you need to have someone help you when you read instructions, pamphlets, or other written materials from your doctor or pharmacy?: 5 - Always  Interpreter Needed?: Yes Interpreter Name: Loetta Ringer Patient Declined Interpreter : No  Comments: Daughter and wife is here at visit today Information entered by :: Verdine Grenfell, RMA   Activities of Daily Living     07/19/2023    3:04 PM  In your present state of health, do you have any difficulty performing the following activities:  Hearing? 0  Vision? 0  Difficulty concentrating or making decisions? 0  Walking or climbing stairs? 0  Dressing or bathing? 0  Doing errands, shopping? 0  Preparing Food and eating ? N  Using the Toilet? N  In the past six months, have you accidently leaked urine? N  Do you have problems with loss of bowel control? N  Managing your Medications? N  Managing your Finances? N  Housekeeping or managing your Housekeeping? N    Patient Care Team: Elvira Hammersmith, MD as PCP - General (Internal Medicine) I have updated your Care Teams any recent Medical Services you may have received from other providers in the past year.     Assessment:    This is a routine wellness examination for [place].  Hearing/Vision screen Hearing Screening - Comments:: Denies hearing difficulties   Vision Screening - Comments:: Denies vision issues.    Goals Addressed   None    Depression Screen     07/19/2023    3:10 PM 01/18/2023    3:30 PM 07/20/2022    3:27 PM 12/20/2021    3:32 PM 12/14/2021    3:38 PM 04/27/2021    3:21 PM 08/18/2020   10:39 AM  PHQ 2/9 Scores  PHQ - 2 Score 0 0 0 0 0 0 0  PHQ- 9 Score 0          Fall Risk     07/19/2023    3:07 PM 01/18/2023    3:30 PM 07/20/2022    3:27 PM 12/20/2021    3:32 PM 12/14/2021    3:38 PM  Fall Risk    Falls in the past year? 0 0 0 0 1  Number falls in past yr: 0 0 0 0 0  Injury with Fall? 0 0 0 0 1  Risk for fall due to :  No Fall Risks  No Fall Risks Impaired balance/gait  Follow up Falls evaluation completed;Falls prevention discussed Falls evaluation completed Falls evaluation completed Falls evaluation completed Falls evaluation completed    MEDICARE RISK AT HOME:  Medicare Risk at Home Any stairs in or around the home?: No If so, are there any without  handrails?: No Home free of loose throw rugs in walkways, pet beds, electrical cords, etc?: Yes Adequate lighting in your home to reduce risk of falls?: Yes Life alert?: No Use of a cane, walker or w/c?: No Grab bars in the bathroom?: No Shower chair or bench in shower?: No Elevated toilet seat or a handicapped toilet?: No  TIMED UP AND GO:  Was the test performed?  Yes  Length of time to ambulate 10 feet: 15 sec Gait steady and fast without use of assistive device  Cognitive Function: 6CIT completed        07/19/2023    3:08 PM  6CIT Screen  What Year? 0 points  What month? 0 points  What time? 0 points  Count back from 20 0 points  Months in reverse 0 points  Repeat phrase 0 points  Total Score 0 points    Immunizations Immunization History  Administered Date(s) Administered   Fluad Quad(high Dose 65+) 11/01/2018, 11/11/2021   Influenza, High Dose Seasonal PF 11/28/2016, 11/19/2017   Influenza-Unspecified 11/14/2015, 11/28/2016, 12/12/2019, 12/18/2022   Janssen (J&J) SARS-COV-2 Vaccination 04/25/2019   Pneumococcal Conjugate-13 11/07/2016   Pneumococcal Polysaccharide-23 06/24/2018   Tdap 12/09/2021    Screening Tests Health Maintenance  Topic Date Due   Zoster Vaccines- Shingrix (1 of 2) Never done   COVID-19 Vaccine (2 - 2024-25 season) 10/15/2022   INFLUENZA VACCINE  09/14/2023   Medicare Annual Wellness (AWV)  07/18/2024   DTaP/Tdap/Td (2 - Td or Tdap) 12/10/2031   Pneumonia Vaccine 65+ Years  old  Completed   HPV VACCINES  Aged Out   Meningococcal B Vaccine  Aged Out    Health Maintenance  Health Maintenance Due  Topic Date Due   Zoster Vaccines- Shingrix (1 of 2) Never done   COVID-19 Vaccine (2 - 2024-25 season) 10/15/2022   Health Maintenance Items Addressed: See Nurse Notes at the end of this note  Additional Screening:  Vision Screening: Recommended annual ophthalmology exams for early detection of glaucoma and other disorders of the eye. Would you like a referral to an eye doctor? Yes    Dental Screening: Recommended annual dental exams for proper oral hygiene  Community Resource Referral / Chronic Care Management: CRR required this visit?  No   CCM required this visit?  No   Plan:    I have personally reviewed and noted the following in the patient's chart:   Medical and social history Use of alcohol, tobacco or illicit drugs  Current medications and supplements including opioid prescriptions. Patient is not currently taking opioid prescriptions. Functional ability and status Nutritional status Physical activity Advanced directives List of other physicians Hospitalizations, surgeries, and ER visits in previous 12 months Vitals Screenings to include cognitive, depression, and falls Referrals and appointments  In addition, I have reviewed and discussed with patient certain preventive protocols, quality metrics, and best practice recommendations. A written personalized care plan for preventive services as well as general preventive health recommendations were provided to patient.   Jakota Manthei L Kevontae Burgoon, CMA   07/23/2023   After Visit Summary: (In Person-Printed) AVS printed and given to the patient  Notes: Please refer to Routing Comments.

## 2023-07-19 NOTE — Assessment & Plan Note (Signed)
 Chronic stable condition Hemoglobin A1c done today Diet and nutrition discussed

## 2023-07-19 NOTE — Progress Notes (Signed)
 Kenneth Moore 83 y.o.   Chief Complaint  Patient presents with   Medical Management of Chronic Issues    6 Month follow up    HISTORY OF PRESENT ILLNESS: This is a 83 y.o. male here for 16-month follow-up of multiple chronic medical conditions Here with interpreter.  Overall doing well.  Has no complaints or medical concerns today.  HPI   Prior to Admission medications   Medication Sig Start Date End Date Taking? Authorizing Provider  lisinopril  (ZESTRIL ) 10 MG tablet Take 1 tablet (10 mg total) by mouth daily. 01/18/23  Yes Iline Buchinger, Isidro Margo, MD  rosuvastatin  (CRESTOR ) 20 MG tablet Take 1 tablet (20 mg total) by mouth daily. 01/18/23  Yes Kainen Struckman, Isidro Margo, MD  naproxen  (NAPROSYN ) 500 MG tablet Take 1 tablet (500 mg total) by mouth 2 (two) times daily. Patient not taking: Reported on 12/20/2021 05/07/20   Corbin Dess, PA-C    No Known Allergies  Patient Active Problem List   Diagnosis Date Noted   Stage 3a chronic kidney disease (HCC) 01/18/2023   Hypertriglyceridemia 04/28/2021   Dyslipidemia 01/02/2019   Prediabetes 01/02/2019   Essential hypertension 06/24/2018   Arthritis, shoulder region 11/07/2016    Past Medical History:  Diagnosis Date   Hypertension     History reviewed. No pertinent surgical history.  Social History   Socioeconomic History   Marital status: Married    Spouse name: Not on file   Number of children: 3   Years of education: Not on file   Highest education level: Not on file  Occupational History   Occupation: RETIRED  Tobacco Use   Smoking status: Never   Smokeless tobacco: Never  Vaping Use   Vaping status: Never Used  Substance and Sexual Activity   Alcohol use: No   Drug use: Not Currently   Sexual activity: Not Currently  Other Topics Concern   Not on file  Social History Narrative   Lives with wife and daughter/2025   Social Drivers of Health   Financial Resource Strain: Low Risk  (07/19/2023)   Overall  Financial Resource Strain (CARDIA)    Difficulty of Paying Living Expenses: Not hard at all  Food Insecurity: No Food Insecurity (07/19/2023)   Hunger Vital Sign    Worried About Running Out of Food in the Last Year: Never true    Ran Out of Food in the Last Year: Never true  Transportation Needs: No Transportation Needs (07/19/2023)   PRAPARE - Administrator, Civil Service (Medical): No    Lack of Transportation (Non-Medical): No  Physical Activity: Insufficiently Active (07/19/2023)   Exercise Vital Sign    Days of Exercise per Week: 4 days    Minutes of Exercise per Session: 20 min  Stress: No Stress Concern Present (07/19/2023)   [employer] of Occupational Health - Occupational Stress Questionnaire    Feeling of Stress : Not at all  Social Connections: Moderately Integrated (07/19/2023)   Social Connection and Isolation Panel [NHANES]    Frequency of Communication with Friends and Family: Never    Frequency of Social Gatherings with Friends and Family: Never    Attends Religious Services: More than 4 times per year    Active Member of [employer] or Organizations: Yes    Attends Banker Meetings: Never    Marital Status: Married  Catering manager Violence: Not At Risk (07/19/2023)   Humiliation, Afraid, Rape, and Kick questionnaire    Fear of Current or Ex-Partner:  No    Emotionally Abused: No    Physically Abused: No    Sexually Abused: No    History reviewed. No pertinent family history.   Review of Systems  Constitutional: Negative.  Negative for chills and fever.  HENT: Negative.  Negative for congestion and sore throat.   Respiratory: Negative.  Negative for cough and shortness of breath.   Cardiovascular:  Negative for chest pain and palpitations.  Gastrointestinal:  Negative for abdominal pain, diarrhea, nausea and vomiting.  Genitourinary: Negative.  Negative for dysuria and hematuria.  Skin: Negative.  Negative for rash.  Neurological: Negative.   Negative for dizziness and headaches.  All other systems reviewed and are negative.   Vitals:   07/19/23 1520  BP: 130/70  Pulse: 94  Temp: 98.1 F (36.7 C)  SpO2: 99%    Physical Exam Vitals reviewed.  Constitutional:      Appearance: Normal appearance.  HENT:     Head: Normocephalic.     Mouth/Throat:     Mouth: Mucous membranes are moist.     Pharynx: Oropharynx is clear.  Eyes:     Extraocular Movements: Extraocular movements intact.     Conjunctiva/sclera: Conjunctivae normal.     Pupils: Pupils are equal, round, and reactive to light.  Cardiovascular:     Rate and Rhythm: Normal rate and regular rhythm.     Pulses: Normal pulses.     Heart sounds: Normal heart sounds.  Pulmonary:     Effort: Pulmonary effort is normal.     Breath sounds: Normal breath sounds.  Abdominal:     Palpations: Abdomen is soft.     Tenderness: There is no abdominal tenderness.  Musculoskeletal:     Cervical back: No tenderness.  Lymphadenopathy:     Cervical: No cervical adenopathy.  Skin:    General: Skin is warm and dry.     Capillary Refill: Capillary refill takes less than 2 seconds.  Neurological:     Mental Status: He is alert and oriented to person, place, and time.  Psychiatric:        Mood and Affect: Mood normal.        Behavior: Behavior normal.      ASSESSMENT & PLAN: A total of 45 minutes was spent with the patient and counseling/coordination of care regarding preparing for this visit, review of most recent office visit notes, review of multiple chronic medical conditions and their management, review of all medications, review of most recent bloodwork results, review of health maintenance items, education on nutrition, prognosis, documentation, and need for follow up.   Problem List Items Addressed This Visit       Cardiovascular and Mediastinum   Essential hypertension - Primary   Well-controlled hypertension with normal blood pressure readings at  home Continue lisinopril  10 mg daily Cardiovascular risks associated with hypertension discussed Dietary approaches to stop hypertension discussed Blood work done today Follow-up in 6 months      Relevant Orders   CBC with Differential/Platelet   Comprehensive metabolic panel with GFR   Hemoglobin A1c   Lipid panel     Genitourinary   Stage 3a chronic kidney disease (HCC)   Chronic stable condition Advised to stay well-hydrated and avoid frequent use of NSAIDs      Relevant Orders   Comprehensive metabolic panel with GFR     Other   Dyslipidemia   Diet and nutrition discussed Cardiovascular risks associated with dyslipidemia discussed Lipid profile done today Continue rosuvastatin  20 mg  daily      Relevant Orders   Comprehensive metabolic panel with GFR   Hemoglobin A1c   Lipid panel   Prediabetes   Chronic stable condition Hemoglobin A1c done today Diet and nutrition discussed      Relevant Orders   Comprehensive metabolic panel with GFR   Hemoglobin A1c   Patient Instructions  Health Maintenance After Age 21 After age 73, you are at a higher risk for certain long-term diseases and infections as well as injuries from falls. Falls are a major cause of broken bones and head injuries in people who are older than age 50. Getting regular preventive care can help to keep you healthy and well. Preventive care includes getting regular testing and making lifestyle changes as recommended by your health care provider. Talk with your health care provider about: Which screenings and tests you should have. A screening is a test that checks for a disease when you have no symptoms. A diet and exercise plan that is right for you. What should I know about screenings and tests to prevent falls? Screening and testing are the best ways to find a health problem early. Early diagnosis and treatment give you the best chance of managing medical conditions that are common after age 7.  Certain conditions and lifestyle choices may make you more likely to have a fall. Your health care provider may recommend: Regular vision checks. Poor vision and conditions such as cataracts can make you more likely to have a fall. If you wear glasses, make sure to get your prescription updated if your vision changes. Medicine review. Work with your health care provider to regularly review all of the medicines you are taking, including over-the-counter medicines. Ask your health care provider about any side effects that may make you more likely to have a fall. Tell your health care provider if any medicines that you take make you feel dizzy or sleepy. Strength and balance checks. Your health care provider may recommend certain tests to check your strength and balance while standing, walking, or changing positions. Foot health exam. Foot pain and numbness, as well as not wearing proper footwear, can make you more likely to have a fall. Screenings, including: Osteoporosis screening. Osteoporosis is a condition that causes the bones to get weaker and break more easily. Blood pressure screening. Blood pressure changes and medicines to control blood pressure can make you feel dizzy. Depression screening. You may be more likely to have a fall if you have a fear of falling, feel depressed, or feel unable to do activities that you used to do. Alcohol use screening. Using too much alcohol can affect your balance and may make you more likely to have a fall. Follow these instructions at home: Lifestyle Do not drink alcohol if: Your health care provider tells you not to drink. If you drink alcohol: Limit how much you have to: 0-1 drink a day for women. 0-2 drinks a day for men. Know how much alcohol is in your drink. In the [place] one drink equals one 12 oz bottle of beer (355 mL), one 5 oz glass of wine (148 mL), or one 1 oz glass of hard liquor (44 mL). Do not use any products that contain nicotine or  tobacco. These products include cigarettes, chewing tobacco, and vaping devices, such as e-cigarettes. If you need help quitting, ask your health care provider. Activity  Follow a regular exercise program to stay fit. This will help you maintain your balance. Ask your health  care provider what types of exercise are appropriate for you. If you need a cane or walker, use it as recommended by your health care provider. Wear supportive shoes that have nonskid soles. Safety  Remove any tripping hazards, such as rugs, cords, and clutter. Install safety equipment such as grab bars in bathrooms and safety rails on stairs. Keep rooms and walkways well-lit. General instructions Talk with your health care provider about your risks for falling. Tell your health care provider if: You fall. Be sure to tell your health care provider about all falls, even ones that seem minor. You feel dizzy, tiredness (fatigue), or off-balance. Take over-the-counter and prescription medicines only as told by your health care provider. These include supplements. Eat a healthy diet and maintain a healthy weight. A healthy diet includes low-fat dairy products, low-fat (lean) meats, and fiber from whole grains, beans, and lots of fruits and vegetables. Stay current with your vaccines. Schedule regular health, dental, and eye exams. Summary Having a healthy lifestyle and getting preventive care can help to protect your health and wellness after age 23. Screening and testing are the best way to find a health problem early and help you avoid having a fall. Early diagnosis and treatment give you the best chance for managing medical conditions that are more common for people who are older than age 2. Falls are a major cause of broken bones and head injuries in people who are older than age 57. Take precautions to prevent a fall at home. Work with your health care provider to learn what changes you can make to improve your health and  wellness and to prevent falls. This information is not intended to replace advice given to you by your health care provider. Make sure you discuss any questions you have with your health care provider. Document Revised: 06/21/2020 Document Reviewed: 06/21/2020 Elsevier Patient Education  2024 Elsevier Inc.     Maryagnes Small, MD [facility]

## 2023-07-19 NOTE — Patient Instructions (Signed)
 Mr. Kenneth Moore , Thank you for taking time out of your busy schedule to complete your Annual Wellness Visit with me. I enjoyed our conversation and look forward to speaking with you again next year. I, as well as your care team,  appreciate your ongoing commitment to your health goals. Please review the following plan we discussed and let me know if I can assist you in the future. Your Game plan/ To Do List     Follow up Visits: Next Medicare AWV with our clinical staff: 07/23/2024.   Have you seen your provider in the last 6 months (3 months if uncontrolled diabetes)? Yes Next Office Visit with your provider: 07/19/2023.  Clinician Recommendations:  Aim for 30 minutes of exercise or brisk walking, 6-8 glasses of water, and 5 servings of fruits and vegetables each day.       This is a list of the screening recommended for you and due dates:  Health Maintenance  Topic Date Due   Zoster (Shingles) Vaccine (1 of 2) Never done   COVID-19 Vaccine (2 - 2024-25 season) 10/15/2022   Flu Shot  09/14/2023   Medicare Annual Wellness Visit  07/18/2024   DTaP/Tdap/Td vaccine (2 - Td or Tdap) 12/10/2031   Pneumonia Vaccine  Completed   HPV Vaccine  Aged Out   Meningitis B Vaccine  Aged Out    Advanced directives: (Declined) Advance directive discussed with you today. Even though you declined this today, please call our office should you change your mind, and we can give you the proper paperwork for you to fill out. Advance Care Planning is important because it:  [x]  Makes sure you receive the medical care that is consistent with your values, goals, and preferences  [x]  It provides guidance to your family and loved ones and reduces their decisional burden about whether or not they are making the right decisions based on your wishes.  Follow the link provided in your after visit summary or read over the paperwork we have mailed to you to help you started getting your Advance Directives in place. If you need  assistance in completing these, please reach out to us  so that we can help you!  See attachments for Preventive Care and Fall Prevention Tips.

## 2023-07-19 NOTE — Assessment & Plan Note (Signed)
Well-controlled hypertension with normal blood pressure readings at home Continue lisinopril 10 mg daily Cardiovascular risks associated with hypertension discussed Dietary approaches to stop hypertension discussed Blood work done today Follow-up in 6 months

## 2023-07-19 NOTE — Assessment & Plan Note (Signed)
 Chronic stable condition Advised to stay well-hydrated and avoid frequent use of NSAIDs

## 2023-07-20 ENCOUNTER — Ambulatory Visit: Payer: Self-pay | Admitting: Emergency Medicine

## 2023-07-20 ENCOUNTER — Telehealth: Payer: Self-pay

## 2023-07-20 LAB — LDL CHOLESTEROL, DIRECT: Direct LDL: 148 mg/dL

## 2023-07-20 LAB — LIPID PANEL
Cholesterol: 268 mg/dL — ABNORMAL HIGH (ref 0–200)
HDL: 36.9 mg/dL — ABNORMAL LOW (ref >39.00)
NonHDL: 231.33
Total CHOL/HDL Ratio: 7
Triglycerides: 657 mg/dL — ABNORMAL HIGH (ref 0.0–149.0)
VLDL: 131.4 mg/dL — ABNORMAL HIGH (ref 0.0–40.0)

## 2023-07-20 LAB — COMPREHENSIVE METABOLIC PANEL WITH GFR
ALT: 16 U/L (ref 0–53)
AST: 20 U/L (ref 0–37)
Albumin: 4.1 g/dL (ref 3.5–5.2)
Alkaline Phosphatase: 76 U/L (ref 39–117)
BUN: 24 mg/dL — ABNORMAL HIGH (ref 6–23)
CO2: 22 meq/L (ref 19–32)
Calcium: 9.4 mg/dL (ref 8.4–10.5)
Chloride: 105 meq/L (ref 96–112)
Creatinine, Ser: 1.34 mg/dL (ref 0.40–1.50)
GFR: 49.48 mL/min — ABNORMAL LOW (ref >60.00)
Glucose, Bld: 115 mg/dL — ABNORMAL HIGH (ref 70–99)
Potassium: 4.4 meq/L (ref 3.5–5.1)
Sodium: 138 meq/L (ref 135–145)
Total Bilirubin: 0.4 mg/dL (ref 0.2–1.2)
Total Protein: 7.2 g/dL (ref 6.0–8.3)

## 2023-07-20 NOTE — Telephone Encounter (Signed)
 Copied from CRM [id number]. Topic: General - Other >> Jul 20, 2023  4:03 PM Trula Gable C wrote: Reason for CRM: Patient called in , relayed results , patient stated they understood and had no questions would like for results to mailed to house

## 2024-01-17 ENCOUNTER — Encounter: Payer: Self-pay | Admitting: Emergency Medicine

## 2024-01-17 ENCOUNTER — Ambulatory Visit: Admitting: Emergency Medicine

## 2024-01-17 VITALS — BP 124/80 | HR 97 | Temp 97.9°F | Ht 62.75 in | Wt 127.0 lb

## 2024-01-17 DIAGNOSIS — N1831 Chronic kidney disease, stage 3a: Secondary | ICD-10-CM | POA: Diagnosis not present

## 2024-01-17 DIAGNOSIS — E785 Hyperlipidemia, unspecified: Secondary | ICD-10-CM | POA: Diagnosis not present

## 2024-01-17 DIAGNOSIS — I10 Essential (primary) hypertension: Secondary | ICD-10-CM

## 2024-01-17 DIAGNOSIS — R7303 Prediabetes: Secondary | ICD-10-CM

## 2024-01-17 LAB — COMPREHENSIVE METABOLIC PANEL WITH GFR
ALT: 17 U/L (ref 0–53)
AST: 25 U/L (ref 0–37)
Albumin: 4.2 g/dL (ref 3.5–5.2)
Alkaline Phosphatase: 72 U/L (ref 39–117)
BUN: 21 mg/dL (ref 6–23)
CO2: 28 meq/L (ref 19–32)
Calcium: 9.6 mg/dL (ref 8.4–10.5)
Chloride: 102 meq/L (ref 96–112)
Creatinine, Ser: 1.24 mg/dL (ref 0.40–1.50)
GFR: 54.11 mL/min — ABNORMAL LOW (ref >60.00)
Glucose, Bld: 108 mg/dL — ABNORMAL HIGH (ref 70–99)
Potassium: 4.3 meq/L (ref 3.5–5.1)
Sodium: 137 meq/L (ref 135–145)
Total Bilirubin: 0.5 mg/dL (ref 0.2–1.2)
Total Protein: 7 g/dL (ref 6.0–8.3)

## 2024-01-17 LAB — LIPID PANEL
Cholesterol: 254 mg/dL — ABNORMAL HIGH (ref 0–200)
HDL: 38.5 mg/dL — ABNORMAL LOW (ref >39.00)
NonHDL: 215.85
Total CHOL/HDL Ratio: 7
Triglycerides: 424 mg/dL — ABNORMAL HIGH (ref 0.0–149.0)
VLDL: 84.8 mg/dL — ABNORMAL HIGH (ref 0.0–40.0)

## 2024-01-17 LAB — CBC WITH DIFFERENTIAL/PLATELET
Basophils Absolute: 0 K/uL (ref 0.0–0.1)
Basophils Relative: 0.5 % (ref 0.0–3.0)
Eosinophils Absolute: 0.1 K/uL (ref 0.0–0.7)
Eosinophils Relative: 1.7 % (ref 0.0–5.0)
HCT: 43.1 % (ref 39.0–52.0)
Hemoglobin: 14.5 g/dL (ref 13.0–17.0)
Lymphocytes Relative: 22.8 % (ref 12.0–46.0)
Lymphs Abs: 1.6 K/uL (ref 0.7–4.0)
MCHC: 33.7 g/dL (ref 30.0–36.0)
MCV: 96.9 fl (ref 78.0–100.0)
Monocytes Absolute: 0.7 K/uL (ref 0.1–1.0)
Monocytes Relative: 10.4 % (ref 3.0–12.0)
Neutro Abs: 4.6 K/uL (ref 1.4–7.7)
Neutrophils Relative %: 64.6 % (ref 43.0–77.0)
Platelets: 181 K/uL (ref 150.0–400.0)
RBC: 4.45 Mil/uL (ref 4.22–5.81)
RDW: 13.6 % (ref 11.5–15.5)
WBC: 7.2 K/uL (ref 4.0–10.5)

## 2024-01-17 LAB — POCT GLYCOSYLATED HEMOGLOBIN (HGB A1C): HbA1c POC (<> result, manual entry): 6 % (ref 4.0–5.6)

## 2024-01-17 LAB — LDL CHOLESTEROL, DIRECT: Direct LDL: 148 mg/dL

## 2024-01-17 MED ORDER — ROSUVASTATIN CALCIUM 20 MG PO TABS: 20.0000 mg | ORAL_TABLET | Freq: Every day | ORAL | 3 refills | Status: AC

## 2024-01-17 MED ORDER — LISINOPRIL 10 MG PO TABS: 10.0000 mg | ORAL_TABLET | Freq: Every day | ORAL | 3 refills | Status: AC

## 2024-01-17 NOTE — Patient Instructions (Signed)
 Health Maintenance After Age 82 After age 27, you are at a higher risk for certain long-term diseases and infections as well as injuries from falls. Falls are a major cause of broken bones and head injuries in people who are older than age 73. Getting regular preventive care can help to keep you healthy and well. Preventive care includes getting regular testing and making lifestyle changes as recommended by your health care provider. Talk with your health care provider about: Which screenings and tests you should have. A screening is a test that checks for a disease when you have no symptoms. A diet and exercise plan that is right for you. What should I know about screenings and tests to prevent falls? Screening and testing are the best ways to find a health problem early. Early diagnosis and treatment give you the best chance of managing medical conditions that are common after age 90. Certain conditions and lifestyle choices may make you more likely to have a fall. Your health care provider may recommend: Regular vision checks. Poor vision and conditions such as cataracts can make you more likely to have a fall. If you wear glasses, make sure to get your prescription updated if your vision changes. Medicine review. Work with your health care provider to regularly review all of the medicines you are taking, including over-the-counter medicines. Ask your health care provider about any side effects that may make you more likely to have a fall. Tell your health care provider if any medicines that you take make you feel dizzy or sleepy. Strength and balance checks. Your health care provider may recommend certain tests to check your strength and balance while standing, walking, or changing positions. Foot health exam. Foot pain and numbness, as well as not wearing proper footwear, can make you more likely to have a fall. Screenings, including: Osteoporosis screening. Osteoporosis is a condition that causes  the bones to get weaker and break more easily. Blood pressure screening. Blood pressure changes and medicines to control blood pressure can make you feel dizzy. Depression screening. You may be more likely to have a fall if you have a fear of falling, feel depressed, or feel unable to do activities that you used to do. Alcohol  use screening. Using too much alcohol  can affect your balance and may make you more likely to have a fall. Follow these instructions at home: Lifestyle Do not drink alcohol  if: Your health care provider tells you not to drink. If you drink alcohol : Limit how much you have to: 0-1 drink a day for women. 0-2 drinks a day for men. Know how much alcohol  is in your drink. In the [place] one drink equals one 12 oz bottle of beer (355 mL), one 5 oz glass of wine (148 mL), or one 1 oz glass of hard liquor (44 mL). Do not use any products that contain nicotine or tobacco. These products include cigarettes, chewing tobacco, and vaping devices, such as e-cigarettes. If you need help quitting, ask your health care provider. Activity  Follow a regular exercise program to stay fit. This will help you maintain your balance. Ask your health care provider what types of exercise are appropriate for you. If you need a cane or walker, use it as recommended by your health care provider. Wear supportive shoes that have nonskid soles. Safety  Remove any tripping hazards, such as rugs, cords, and clutter. Install safety equipment such as grab bars in bathrooms and safety rails on stairs. Keep rooms and walkways  well-lit. General instructions Talk with your health care provider about your risks for falling. Tell your health care provider if: You fall. Be sure to tell your health care provider about all falls, even ones that seem minor. You feel dizzy, tiredness (fatigue), or off-balance. Take over-the-counter and prescription medicines only as told by your health care provider. These include  supplements. Eat a healthy diet and maintain a healthy weight. A healthy diet includes low-fat dairy products, low-fat (lean) meats, and fiber from whole grains, beans, and lots of fruits and vegetables. Stay current with your vaccines. Schedule regular health, dental, and eye exams. Summary Having a healthy lifestyle and getting preventive care can help to protect your health and wellness after age 15. Screening and testing are the best way to find a health problem early and help you avoid having a fall. Early diagnosis and treatment give you the best chance for managing medical conditions that are more common for people who are older than age 42. Falls are a major cause of broken bones and head injuries in people who are older than age 64. Take precautions to prevent a fall at home. Work with your health care provider to learn what changes you can make to improve your health and wellness and to prevent falls. This information is not intended to replace advice given to you by your health care provider. Make sure you discuss any questions you have with your health care provider. Document Revised: 06/21/2020 Document Reviewed: 06/21/2020 Elsevier Patient Education  2024 [employer].

## 2024-01-17 NOTE — Assessment & Plan Note (Signed)
 Chronic stable condition Hemoglobin A1c done today Diet and nutrition discussed

## 2024-01-17 NOTE — Assessment & Plan Note (Signed)
Diet and nutrition discussed Cardiovascular risks associated with dyslipidemia discussed Lipid profile done today Continue rosuvastatin 20 mg daily

## 2024-01-17 NOTE — Assessment & Plan Note (Signed)
 BP Readings from Last 3 Encounters:  01/17/24 124/80  07/19/23 130/70  07/19/23 130/70  Well-controlled hypertension with normal blood pressure readings at home Continue lisinopril  10 mg daily Cardiovascular risks associated with hypertension discussed Dietary approaches to stop hypertension discussed Blood work done today Follow-up in 6 months

## 2024-01-17 NOTE — Assessment & Plan Note (Signed)
 Chronic stable condition Advised to stay well-hydrated and avoid frequent use of NSAIDs

## 2024-01-17 NOTE — Progress Notes (Signed)
 Kenneth Moore 83 y.o.   Chief Complaint  Patient presents with   Follow-up    6 months     HISTORY OF PRESENT ILLNESS: This is a 83 y.o. male here for 13-month follow-up of chronic medical conditions including hypertension and dyslipidemia Accompanied by daughter who is helping with translation Overall doing well.  Has no complaints or medical concerns today.  HPI   Prior to Admission medications   Medication Sig Start Date End Date Taking? Authorizing Provider  lisinopril  (ZESTRIL ) 10 MG tablet Take 1 tablet (10 mg total) by mouth daily. 01/18/23  Yes Miyah Hampshire, Emil Schanz, MD  rosuvastatin  (CRESTOR ) 20 MG tablet Take 1 tablet (20 mg total) by mouth daily. 01/18/23  Yes Khriz Liddy, Emil Schanz, MD  naproxen  (NAPROSYN ) 500 MG tablet Take 1 tablet (500 mg total) by mouth 2 (two) times daily. Patient not taking: Reported on 01/17/2024 05/07/20   Stuart Vernell Norris, PA-C    No Known Allergies  Patient Active Problem List   Diagnosis Date Noted   Stage 3a chronic kidney disease (HCC) 01/18/2023   Hypertriglyceridemia 04/28/2021   Dyslipidemia 01/02/2019   Prediabetes 01/02/2019   Essential hypertension 06/24/2018   Arthritis, shoulder region 11/07/2016    Past Medical History:  Diagnosis Date   Hypertension     No past surgical history on file.  Social History   Socioeconomic History   Marital status: Married    Spouse name: Not on file   Number of children: 3   Years of education: Not on file   Highest education level: Not on file  Occupational History   Occupation: RETIRED  Tobacco Use   Smoking status: Never   Smokeless tobacco: Never  Vaping Use   Vaping status: Never Used  Substance and Sexual Activity   Alcohol use: No   Drug use: Not Currently   Sexual activity: Not Currently  Other Topics Concern   Not on file  Social History Narrative   Lives with wife and daughter/2025   Social Drivers of Health   Financial Resource Strain: Low Risk  (07/19/2023)    Overall Financial Resource Strain (CARDIA)    Difficulty of Paying Living Expenses: Not hard at all  Food Insecurity: No Food Insecurity (07/19/2023)   Hunger Vital Sign    Worried About Running Out of Food in the Last Year: Never true    Ran Out of Food in the Last Year: Never true  Transportation Needs: No Transportation Needs (07/19/2023)   PRAPARE - Administrator, Civil Service (Medical): No    Lack of Transportation (Non-Medical): No  Physical Activity: Insufficiently Active (07/19/2023)   Exercise Vital Sign    Days of Exercise per Week: 4 days    Minutes of Exercise per Session: 20 min  Stress: No Stress Concern Present (07/19/2023)   [employer] of Occupational Health - Occupational Stress Questionnaire    Feeling of Stress : Not at all  Social Connections: Moderately Integrated (07/19/2023)   Social Connection and Isolation Panel    Frequency of Communication with Friends and Family: Never    Frequency of Social Gatherings with Friends and Family: Never    Attends Religious Services: More than 4 times per year    Active Member of [employer] or Organizations: Yes    Attends Banker Meetings: Never    Marital Status: Married  Catering Manager Violence: Not At Risk (07/19/2023)   Humiliation, Afraid, Rape, and Kick questionnaire    Fear of Current or  Ex-Partner: No    Emotionally Abused: No    Physically Abused: No    Sexually Abused: No    No family history on file.   Review of Systems  Constitutional: Negative.  Negative for chills and fever.  HENT: Negative.  Negative for congestion and sore throat.   Respiratory: Negative.  Negative for cough and shortness of breath.   Cardiovascular: Negative.  Negative for chest pain and palpitations.  Gastrointestinal:  Negative for abdominal pain, diarrhea, nausea and vomiting.  Genitourinary: Negative.  Negative for dysuria and hematuria.  Skin: Negative.  Negative for rash.  Neurological: Negative.   Negative for dizziness and headaches.  All other systems reviewed and are negative.   Vitals:   01/17/24 1512  BP: 124/80  Pulse: 97  Temp: 97.9 F (36.6 C)  SpO2: 98%    Physical Exam Vitals reviewed.  Constitutional:      Appearance: Normal appearance.  HENT:     Head: Normocephalic.     Mouth/Throat:     Mouth: Mucous membranes are moist.     Pharynx: Oropharynx is clear.  Eyes:     Extraocular Movements: Extraocular movements intact.     Conjunctiva/sclera: Conjunctivae normal.     Pupils: Pupils are equal, round, and reactive to light.  Cardiovascular:     Rate and Rhythm: Normal rate and regular rhythm.     Pulses: Normal pulses.     Heart sounds: Normal heart sounds.  Pulmonary:     Effort: Pulmonary effort is normal.     Breath sounds: Normal breath sounds.  Abdominal:     Palpations: Abdomen is soft.     Tenderness: There is no abdominal tenderness.  Musculoskeletal:     Cervical back: No tenderness.  Lymphadenopathy:     Cervical: No cervical adenopathy.  Skin:    General: Skin is warm and dry.     Capillary Refill: Capillary refill takes less than 2 seconds.  Neurological:     General: No focal deficit present.     Mental Status: He is alert and oriented to person, place, and time.  Psychiatric:        Mood and Affect: Mood normal.        Behavior: Behavior normal.      ASSESSMENT & PLAN: A total of 40 minutes was spent with the patient and counseling/coordination of care regarding preparing for this visit, review of most recent office visit notes, review of multiple chronic medical conditions and their management, review of all medications, review of most recent bloodwork results, review of health maintenance items, education on nutrition, prognosis, documentation, and need for follow up.   Problem List Items Addressed This Visit       Cardiovascular and Mediastinum   Essential hypertension - Primary   BP Readings from Last 3 Encounters:   01/17/24 124/80  07/19/23 130/70  07/19/23 130/70  Well-controlled hypertension with normal blood pressure readings at home Continue lisinopril  10 mg daily Cardiovascular risks associated with hypertension discussed Dietary approaches to stop hypertension discussed Blood work done today Follow-up in 6 months       Relevant Medications   lisinopril  (ZESTRIL ) 10 MG tablet   rosuvastatin  (CRESTOR ) 20 MG tablet   Other Relevant Orders   Comprehensive metabolic panel with GFR   CBC with Differential/Platelet   Lipid panel     Genitourinary   Stage 3a chronic kidney disease (HCC)   Chronic stable condition Advised to stay well-hydrated and avoid frequent use of NSAIDs  Other   Dyslipidemia   Diet and nutrition discussed Cardiovascular risks associated with dyslipidemia discussed Lipid profile done today Continue rosuvastatin  20 mg daily      Relevant Medications   rosuvastatin  (CRESTOR ) 20 MG tablet   Other Relevant Orders   Comprehensive metabolic panel with GFR   CBC with Differential/Platelet   Lipid panel   Prediabetes   Chronic stable condition Hemoglobin A1c done today Diet and nutrition discussed      Relevant Orders   POCT HgB A1C (Completed)   Patient Instructions  Health Maintenance After Age 29 After age 78, you are at a higher risk for certain long-term diseases and infections as well as injuries from falls. Falls are a major cause of broken bones and head injuries in people who are older than age 32. Getting regular preventive care can help to keep you healthy and well. Preventive care includes getting regular testing and making lifestyle changes as recommended by your health care provider. Talk with your health care provider about: Which screenings and tests you should have. A screening is a test that checks for a disease when you have no symptoms. A diet and exercise plan that is right for you. What should I know about screenings and tests to  prevent falls? Screening and testing are the best ways to find a health problem early. Early diagnosis and treatment give you the best chance of managing medical conditions that are common after age 72. Certain conditions and lifestyle choices may make you more likely to have a fall. Your health care provider may recommend: Regular vision checks. Poor vision and conditions such as cataracts can make you more likely to have a fall. If you wear glasses, make sure to get your prescription updated if your vision changes. Medicine review. Work with your health care provider to regularly review all of the medicines you are taking, including over-the-counter medicines. Ask your health care provider about any side effects that may make you more likely to have a fall. Tell your health care provider if any medicines that you take make you feel dizzy or sleepy. Strength and balance checks. Your health care provider may recommend certain tests to check your strength and balance while standing, walking, or changing positions. Foot health exam. Foot pain and numbness, as well as not wearing proper footwear, can make you more likely to have a fall. Screenings, including: Osteoporosis screening. Osteoporosis is a condition that causes the bones to get weaker and break more easily. Blood pressure screening. Blood pressure changes and medicines to control blood pressure can make you feel dizzy. Depression screening. You may be more likely to have a fall if you have a fear of falling, feel depressed, or feel unable to do activities that you used to do. Alcohol use screening. Using too much alcohol can affect your balance and may make you more likely to have a fall. Follow these instructions at home: Lifestyle Do not drink alcohol if: Your health care provider tells you not to drink. If you drink alcohol: Limit how much you have to: 0-1 drink a day for women. 0-2 drinks a day for men. Know how much alcohol is in your  drink. In the [place] one drink equals one 12 oz bottle of beer (355 mL), one 5 oz glass of wine (148 mL), or one 1 oz glass of hard liquor (44 mL). Do not use any products that contain nicotine or tobacco. These products include cigarettes, chewing tobacco, and vaping devices, such  as e-cigarettes. If you need help quitting, ask your health care provider. Activity  Follow a regular exercise program to stay fit. This will help you maintain your balance. Ask your health care provider what types of exercise are appropriate for you. If you need a cane or walker, use it as recommended by your health care provider. Wear supportive shoes that have nonskid soles. Safety  Remove any tripping hazards, such as rugs, cords, and clutter. Install safety equipment such as grab bars in bathrooms and safety rails on stairs. Keep rooms and walkways well-lit. General instructions Talk with your health care provider about your risks for falling. Tell your health care provider if: You fall. Be sure to tell your health care provider about all falls, even ones that seem minor. You feel dizzy, tiredness (fatigue), or off-balance. Take over-the-counter and prescription medicines only as told by your health care provider. These include supplements. Eat a healthy diet and maintain a healthy weight. A healthy diet includes low-fat dairy products, low-fat (lean) meats, and fiber from whole grains, beans, and lots of fruits and vegetables. Stay current with your vaccines. Schedule regular health, dental, and eye exams. Summary Having a healthy lifestyle and getting preventive care can help to protect your health and wellness after age 82. Screening and testing are the best way to find a health problem early and help you avoid having a fall. Early diagnosis and treatment give you the best chance for managing medical conditions that are more common for people who are older than age 72. Falls are a major cause of broken bones  and head injuries in people who are older than age 27. Take precautions to prevent a fall at home. Work with your health care provider to learn what changes you can make to improve your health and wellness and to prevent falls. This information is not intended to replace advice given to you by your health care provider. Make sure you discuss any questions you have with your health care provider. Document Revised: 06/21/2020 Document Reviewed: 06/21/2020 Elsevier Patient Education  2024 [employer]     Emil Schaumann, MD  Primary [facility] at [facility]

## 2024-01-18 ENCOUNTER — Ambulatory Visit: Payer: Self-pay | Admitting: Emergency Medicine

## 2024-01-18 NOTE — Progress Notes (Signed)
 I have called the patient but results will be mailed due to no answer

## 2024-07-23 ENCOUNTER — Ambulatory Visit
